# Patient Record
Sex: Female | Born: 1975 | Hispanic: Yes | Marital: Married | Smoking: Never smoker
Health system: Southern US, Community
[De-identification: ages and names within clinical notes are randomized; demographics above are authoritative.]

## PROBLEM LIST (undated history)

## (undated) DIAGNOSIS — E079 Disorder of thyroid, unspecified: Secondary | ICD-10-CM

## (undated) DIAGNOSIS — J45909 Unspecified asthma, uncomplicated: Secondary | ICD-10-CM

---

## 2011-10-20 ENCOUNTER — Other Ambulatory Visit (HOSPITAL_COMMUNITY)
Admission: RE | Admit: 2011-10-20 | Discharge: 2011-10-20 | Disposition: A | Discharge status: Home / Self Care | Payer: BC Managed Care – PPO | Source: Ambulatory Visit | Attending: Obstetrics and Gynecology | Admitting: Obstetrics and Gynecology

## 2011-10-20 DIAGNOSIS — Z124 Encounter for screening for malignant neoplasm of cervix: Secondary | ICD-10-CM | POA: Insufficient documentation

## 2013-02-06 DIAGNOSIS — Z3480 Encounter for supervision of other normal pregnancy, unspecified trimester: Secondary | ICD-10-CM | POA: Insufficient documentation

## 2013-04-13 DIAGNOSIS — O09529 Supervision of elderly multigravida, unspecified trimester: Secondary | ICD-10-CM | POA: Insufficient documentation

## 2013-04-13 DIAGNOSIS — Z3201 Encounter for pregnancy test, result positive: Secondary | ICD-10-CM | POA: Insufficient documentation

## 2013-04-13 DIAGNOSIS — Z331 Pregnant state, incidental: Secondary | ICD-10-CM | POA: Insufficient documentation

## 2013-05-21 DIAGNOSIS — R81 Glycosuria: Secondary | ICD-10-CM | POA: Insufficient documentation

## 2013-06-28 DIAGNOSIS — O0992 Supervision of high risk pregnancy, unspecified, second trimester: Secondary | ICD-10-CM

## 2013-07-29 DIAGNOSIS — L309 Dermatitis, unspecified: Secondary | ICD-10-CM | POA: Insufficient documentation

## 2013-09-02 DIAGNOSIS — Z3009 Encounter for other general counseling and advice on contraception: Secondary | ICD-10-CM | POA: Insufficient documentation

## 2014-02-24 DIAGNOSIS — N921 Excessive and frequent menstruation with irregular cycle: Secondary | ICD-10-CM | POA: Insufficient documentation

## 2016-12-20 ENCOUNTER — Emergency Department (HOSPITAL_COMMUNITY): Payer: BC Managed Care – PPO

## 2016-12-20 ENCOUNTER — Inpatient Hospital Stay (HOSPITAL_COMMUNITY)
Admission: EM | Admit: 2016-12-20 | Discharge: 2016-12-23 | DRG: 781 | Disposition: A | Discharge status: Home / Self Care | Payer: BC Managed Care – PPO | Attending: Internal Medicine | Admitting: Internal Medicine

## 2016-12-20 ENCOUNTER — Encounter (HOSPITAL_COMMUNITY): Payer: Self-pay | Admitting: Emergency Medicine

## 2016-12-20 DIAGNOSIS — O99280 Endocrine, nutritional and metabolic diseases complicating pregnancy, unspecified trimester: Secondary | ICD-10-CM

## 2016-12-20 DIAGNOSIS — J101 Influenza due to other identified influenza virus with other respiratory manifestations: Secondary | ICD-10-CM | POA: Diagnosis not present

## 2016-12-20 DIAGNOSIS — A419 Sepsis, unspecified organism: Secondary | ICD-10-CM | POA: Diagnosis present

## 2016-12-20 DIAGNOSIS — E876 Hypokalemia: Secondary | ICD-10-CM | POA: Diagnosis present

## 2016-12-20 DIAGNOSIS — E0590 Thyrotoxicosis, unspecified without thyrotoxic crisis or storm: Secondary | ICD-10-CM | POA: Diagnosis present

## 2016-12-20 DIAGNOSIS — J45901 Unspecified asthma with (acute) exacerbation: Secondary | ICD-10-CM | POA: Diagnosis present

## 2016-12-20 DIAGNOSIS — O0992 Supervision of high risk pregnancy, unspecified, second trimester: Secondary | ICD-10-CM

## 2016-12-20 DIAGNOSIS — O99282 Endocrine, nutritional and metabolic diseases complicating pregnancy, second trimester: Secondary | ICD-10-CM | POA: Diagnosis present

## 2016-12-20 DIAGNOSIS — O30042 Twin pregnancy, dichorionic/diamniotic, second trimester: Secondary | ICD-10-CM | POA: Diagnosis present

## 2016-12-20 DIAGNOSIS — O30049 Twin pregnancy, dichorionic/diamniotic, unspecified trimester: Secondary | ICD-10-CM | POA: Diagnosis present

## 2016-12-20 DIAGNOSIS — O09522 Supervision of elderly multigravida, second trimester: Secondary | ICD-10-CM

## 2016-12-20 DIAGNOSIS — Z3A21 21 weeks gestation of pregnancy: Secondary | ICD-10-CM

## 2016-12-20 DIAGNOSIS — O98812 Other maternal infectious and parasitic diseases complicating pregnancy, second trimester: Secondary | ICD-10-CM | POA: Diagnosis not present

## 2016-12-20 DIAGNOSIS — O26872 Cervical shortening, second trimester: Secondary | ICD-10-CM | POA: Diagnosis present

## 2016-12-20 DIAGNOSIS — A4189 Other specified sepsis: Secondary | ICD-10-CM | POA: Diagnosis present

## 2016-12-20 DIAGNOSIS — O26879 Cervical shortening, unspecified trimester: Secondary | ICD-10-CM | POA: Diagnosis present

## 2016-12-20 DIAGNOSIS — I959 Hypotension, unspecified: Secondary | ICD-10-CM | POA: Diagnosis not present

## 2016-12-20 DIAGNOSIS — E872 Acidosis: Secondary | ICD-10-CM | POA: Diagnosis present

## 2016-12-20 DIAGNOSIS — J45909 Unspecified asthma, uncomplicated: Secondary | ICD-10-CM | POA: Diagnosis present

## 2016-12-20 DIAGNOSIS — O2622 Pregnancy care for patient with recurrent pregnancy loss, second trimester: Secondary | ICD-10-CM | POA: Diagnosis present

## 2016-12-20 DIAGNOSIS — O99512 Diseases of the respiratory system complicating pregnancy, second trimester: Secondary | ICD-10-CM | POA: Diagnosis present

## 2016-12-20 HISTORY — DX: Disorder of thyroid, unspecified: E07.9

## 2016-12-20 HISTORY — DX: Unspecified asthma, uncomplicated: J45.909

## 2016-12-20 LAB — COMPREHENSIVE METABOLIC PANEL
ALT: 14 U/L (ref 14–54)
ANION GAP: 9 (ref 5–15)
AST: 19 U/L (ref 15–41)
Albumin: 3 g/dL — ABNORMAL LOW (ref 3.5–5.0)
Alkaline Phosphatase: 69 U/L (ref 38–126)
BILIRUBIN TOTAL: 0.5 mg/dL (ref 0.3–1.2)
BUN: 5 mg/dL — AB (ref 6–20)
CO2: 21 mmol/L — ABNORMAL LOW (ref 22–32)
Calcium: 9 mg/dL (ref 8.9–10.3)
Chloride: 106 mmol/L (ref 101–111)
Creatinine, Ser: 0.45 mg/dL (ref 0.44–1.00)
Glucose, Bld: 108 mg/dL — ABNORMAL HIGH (ref 65–99)
POTASSIUM: 3.3 mmol/L — AB (ref 3.5–5.1)
Sodium: 136 mmol/L (ref 135–145)
TOTAL PROTEIN: 6.4 g/dL — AB (ref 6.5–8.1)

## 2016-12-20 LAB — CBC WITH DIFFERENTIAL/PLATELET
BASOS ABS: 0 10*3/uL (ref 0.0–0.1)
Basophils Relative: 0 %
EOS PCT: 0 %
Eosinophils Absolute: 0 10*3/uL (ref 0.0–0.7)
HCT: 36.8 % (ref 36.0–46.0)
HEMOGLOBIN: 12.2 g/dL (ref 12.0–15.0)
LYMPHS PCT: 6 %
Lymphs Abs: 0.6 10*3/uL — ABNORMAL LOW (ref 0.7–4.0)
MCH: 29 pg (ref 26.0–34.0)
MCHC: 33.2 g/dL (ref 30.0–36.0)
MCV: 87.6 fL (ref 78.0–100.0)
Monocytes Absolute: 0.7 10*3/uL (ref 0.1–1.0)
Monocytes Relative: 8 %
NEUTROS ABS: 8.1 10*3/uL — AB (ref 1.7–7.7)
NEUTROS PCT: 86 %
PLATELETS: 126 10*3/uL — AB (ref 150–400)
RBC: 4.2 MIL/uL (ref 3.87–5.11)
RDW: 14.7 % (ref 11.5–15.5)
WBC: 9.4 10*3/uL (ref 4.0–10.5)

## 2016-12-20 LAB — URINALYSIS, ROUTINE W REFLEX MICROSCOPIC
BILIRUBIN URINE: NEGATIVE
Bacteria, UA: NONE SEEN
GLUCOSE, UA: NEGATIVE mg/dL
Hgb urine dipstick: NEGATIVE
KETONES UR: 20 mg/dL — AB
Nitrite: NEGATIVE
PH: 6 (ref 5.0–8.0)
Protein, ur: NEGATIVE mg/dL
Specific Gravity, Urine: 1.012 (ref 1.005–1.030)

## 2016-12-20 LAB — I-STAT ARTERIAL BLOOD GAS, ED
ACID-BASE DEFICIT: 5 mmol/L — AB (ref 0.0–2.0)
BICARBONATE: 18.8 mmol/L — AB (ref 20.0–28.0)
O2 SAT: 96 %
PH ART: 7.404 (ref 7.350–7.450)
PO2 ART: 82 mmHg — AB (ref 83.0–108.0)
Patient temperature: 98
TCO2: 20 mmol/L (ref 0–100)
pCO2 arterial: 29.9 mmHg — ABNORMAL LOW (ref 32.0–48.0)

## 2016-12-20 LAB — T4, FREE: Free T4: 0.62 ng/dL (ref 0.61–1.12)

## 2016-12-20 LAB — I-STAT CG4 LACTIC ACID, ED: LACTIC ACID, VENOUS: 1.06 mmol/L (ref 0.5–1.9)

## 2016-12-20 MED ORDER — IPRATROPIUM-ALBUTEROL 0.5-2.5 (3) MG/3ML IN SOLN
3.0000 mL | Freq: Once | RESPIRATORY_TRACT | Status: AC
  Administered 2016-12-20: 3 mL via RESPIRATORY_TRACT
  Filled 2016-12-20: qty 3

## 2016-12-20 MED ORDER — PREDNISONE 20 MG PO TABS
40.0000 mg | ORAL_TABLET | Freq: Once | ORAL | Status: AC
  Administered 2016-12-20: 40 mg via ORAL
  Filled 2016-12-20: qty 2

## 2016-12-20 MED ORDER — SODIUM CHLORIDE 0.9 % IV BOLUS (SEPSIS)
1000.0000 mL | Freq: Once | INTRAVENOUS | Status: AC
  Administered 2016-12-20: 1000 mL via INTRAVENOUS

## 2016-12-20 NOTE — ED Provider Notes (Signed)
[facility] DEPT Provider Note    By signing my name below, I, Bea Graff, attest that this documentation has been prepared under the direction and in the presence of [facility], . Electronically Signed: Bea Graff, ED Scribe. 12/20/16. 11:20 PM.    History   Chief Complaint Chief Complaint  Patient presents with  . Asthma    The history is provided by the patient and medical records. No language interpreter was used.    Raven Martin is a pregnant 41 y.o.  (G5P1SAB3) with PMHx of asthma at [redacted] weeks gestation with twins who presents to the Emergency Department complaining of a cough that began yesterday. She reports associated HA, mild sore throat, fever, chills and generalized body aches. She has been using her MDI with minimal relief. She denies modifying factors. She denies otalgia, sinus pressure, vaginal bleeding or discharge or any complaint with her pregnancy. She gets her prenatal care in [place] at high risk clinic secondary to hypothyroidism diagnosed during this pregnancy and Advanced Maternal Age with multiple miscarrages.    Past Medical History:  Diagnosis Date  . Asthma   . Thyroid disease     There are no active problems to display for this patient.   History reviewed. No pertinent surgical history.  OB History    Gravida Para Term Preterm AB Living   1             SAB TAB Ectopic Multiple Live Births                   Home Medications    Prior to Admission medications   Medication Sig Start Date End Date Taking? Authorizing Provider  acetaminophen (TYLENOL) 500 MG tablet Take 500 mg by mouth every 6 (six) hours as needed for headache.   Yes Historical Provider, MD  albuterol (PROAIR HFA) 108 (90 Base) MCG/ACT inhaler Inhale 2 puffs into the lungs every 4 (four) hours as needed for wheezing or shortness of breath.  09/07/16  Yes Historical Provider, MD  methimazole (TAPAZOLE) 5 MG tablet Take 5 mg by mouth every other day.  11/28/16 11/28/17 Yes Historical Provider, MD  Prenatal Vit-Fe Fumarate-FA (PRENATAL MULTIVITAMIN) TABS tablet Take 1 tablet by mouth daily.    Yes Historical Provider, MD    Family History History reviewed. No pertinent family history.  Social History Social History  Substance Use Topics  . Smoking status: Never Smoker  . Smokeless tobacco: Never Used  . Alcohol use No     Allergies   Patient has no known allergies.   Review of Systems Review of Systems  Constitutional: Positive for chills, fatigue and fever.  HENT: Positive for sore throat. Negative for congestion, dental problem, ear pain, facial swelling, sinus pressure and trouble swallowing.   Eyes: Negative for photophobia and pain.  Respiratory: Positive for cough and shortness of breath. Negative for wheezing.   Gastrointestinal: Negative for abdominal distention, abdominal pain, diarrhea, nausea and vomiting.  Genitourinary: Negative for dysuria, flank pain and vaginal bleeding.  Musculoskeletal: Positive for myalgias. Negative for back pain and neck stiffness.  Skin: Negative for rash.  Neurological: Positive for headaches. Negative for light-headedness.  Psychiatric/Behavioral: Negative for confusion.     Physical Exam Updated Vital Signs BP 129/74 (BP Location: Right Arm)   Pulse (!) 135   Temp 99.9 F (37.7 C) (Oral)   Resp 18   SpO2 99%   Physical Exam  Constitutional: She is oriented to person, place, and time. She appears  well-developed and well-nourished.  HENT:  Head: Normocephalic.  Right Ear: Tympanic membrane normal.  Left Ear: Tympanic membrane normal.  Mouth/Throat: Uvula is midline, oropharynx is clear and moist and mucous membranes are normal. No posterior oropharyngeal edema or posterior oropharyngeal erythema.  Eyes: EOM are normal. Pupils are equal, round, and reactive to light.  Sclera clear bilaterally.  Neck: Normal range of motion. Neck supple.  Cardiovascular: Regular rhythm.   Tachycardia present.   Pulmonary/Chest: Tachypnea noted. She has decreased breath sounds. She has no wheezes. She has no rhonchi. She has no rales.  Abdominal: Soft. There is no tenderness.  Fetal heart tones- 160 and 145  Musculoskeletal: Normal range of motion.  Lymphadenopathy:    She has no cervical adenopathy.  Neurological: She is alert and oriented to person, place, and time. No cranial nerve deficit.  Skin: Skin is warm and dry.  Psychiatric: She has a normal mood and affect.  Nursing note and vitals reviewed.    ED Treatments / Results  DIAGNOSTIC STUDIES: Oxygen Saturation is 99% on RA, normal by my interpretation.   COORDINATION OF CARE: 7:01 PM- Will order breathing treatment and prescribe steroids. Pt verbalizes understanding and agrees to plan.  8:15 PM- Dr. Billy Fischer examined pt and has concerns for PE. Will order CXR and flu swab.  CRITICAL CARE Performed by: [place], [place]ou Total critical care time: 30 minutes Critical care time was exclusive of separately billable procedures and treating other patients. Critical care was necessary to treat or prevent imminent or life-threatening deterioration. Critical care was time spent personally by me on the following activities: development of treatment plan with patient and/or surrogate as well as nursing, discussions with consultants, evaluation of patient's response to treatment, examination of patient, obtaining history from patient or surrogate, ordering and performing treatments and interventions, ordering and review of laboratory studies, ordering and review of radiographic studies, pulse oximetry and re-evaluation of patient's condition.   Medications  ipratropium-albuterol (DUONEB) 0.5-2.5 (3) MG/3ML nebulizer solution 3 mL (3 mLs Nebulization Given 12/20/16 1925)  predniSONE (DELTASONE) tablet 40 mg (40 mg Oral Given 12/20/16 1923)  sodium chloride 0.9 % bolus 1,000 mL (0 mLs Intravenous Stopped 12/20/16 2240)    oseltamivir (TAMIFLU) capsule 75 mg (75 mg Oral Given 12/21/16 0032)  sodium chloride 0.9 % bolus 1,000 mL (1,000 mLs Intravenous New Bag/Given 12/21/16 0041)    Labs (all labs ordered are listed, but only abnormal results are displayed) Labs Reviewed  INFLUENZA PANEL BY PCR (TYPE A & B) - Abnormal; Notable for the following:       Result Value   Influenza A By PCR POSITIVE (*)    All other components within normal limits  COMPREHENSIVE METABOLIC PANEL - Abnormal; Notable for the following:    Potassium 3.3 (*)    CO2 21 (*)    Glucose, Bld 108 (*)    BUN 5 (*)    Total Protein 6.4 (*)    Albumin 3.0 (*)    All other components within normal limits  CBC WITH DIFFERENTIAL/PLATELET - Abnormal; Notable for the following:    Platelets 126 (*)    Neutro Abs 8.1 (*)    Lymphs Abs 0.6 (*)    All other components within normal limits  URINALYSIS, ROUTINE W REFLEX MICROSCOPIC - Abnormal; Notable for the following:    APPearance HAZY (*)    Ketones, ur 20 (*)    Leukocytes, UA TRACE (*)    Squamous Epithelial / LPF 6-30 (*)  All other components within normal limits  I-STAT ARTERIAL BLOOD GAS, ED - Abnormal; Notable for the following:    pCO2 arterial 29.9 (*)    pO2, Arterial 82.0 (*)    Bicarbonate 18.8 (*)    Acid-base deficit 5.0 (*)    All other components within normal limits  I-STAT CG4 LACTIC ACID, ED - Abnormal; Notable for the following:    Lactic Acid, Venous 2.84 (*)    All other components within normal limits  CULTURE, BLOOD (ROUTINE X 2)  CULTURE, BLOOD (ROUTINE X 2)  T4, FREE  T3  T4  BLOOD GAS, ARTERIAL  I-STAT CG4 LACTIC ACID, ED    EKG  EKG Interpretation  Date/Time:  Tuesday December 20 2016 22:19:49 EST Ventricular Rate:  127 PR Interval:    QRS Duration: 74 QT Interval:  296 QTC Calculation: 431 R Axis:   102 Text Interpretation:  Sinus tachycardia Probable left atrial enlargement Borderline right axis deviation No old tracing to compare  Confirmed by KNAPP  MD-J, JON [id number]) on 12/20/2016 10:26:47 PM      I discussed this case with Dr. Billy Fischer and she evaluated the patient. Will continue IV fluids and lab results.   I discussed this case with Dr. Tomi Bamberger after Dr. Billy Fischer left and additional labs were back.    Radiology Dg Chest 2 View  Result Date: 12/20/2016 CLINICAL DATA:  Cough EXAM: CHEST  2 VIEW COMPARISON:  None. FINDINGS: Lungs are clear. Heart size and pulmonary vascularity are within normal limits. No adenopathy. No bone lesions. IMPRESSION: No edema or consolidation. Electronically Signed   By: Lowella Grip III M.D.   On: 12/20/2016 21:22    Procedures Procedures  Medications Ordered in ED Medications  ipratropium-albuterol (DUONEB) 0.5-2.5 (3) MG/3ML nebulizer solution 3 mL (3 mLs Nebulization Given 12/20/16 1925)  predniSONE (DELTASONE) tablet 40 mg (40 mg Oral Given 12/20/16 1923)  sodium chloride 0.9 % bolus 1,000 mL (0 mLs Intravenous Stopped 12/20/16 2240)  oseltamivir (TAMIFLU) capsule 75 mg (75 mg Oral Given 12/21/16 0032)  sodium chloride 0.9 % bolus 1,000 mL (1,000 mLs Intravenous New Bag/Given 12/21/16 0041)   12 MN consult with Dr. Ihor Dow at [facility]. She request that the patient be admitted here at [facility] since she is less than [redacted] weeks gestation and she will be glad to consult.   Initial Impression / Assessment and Plan / ED Course  I have reviewed the triage vital signs and the nursing notes.  Pertinent labs & imaging results that were available during my care of the patient were reviewed by me and considered in my medical decision making (see chart for details).  I personally performed the services described in this documentation, which was scribed in my presence. The recorded information has been reviewed and is accurate.   Consult with admitting MD and he will accept patient.   Final Clinical Impressions(s) / ED Diagnoses  41 y.o. G5 P1 @ [redacted] weeks gestation with  influenza to be admitted for continued treatment and observation.   Final diagnoses:  Influenza A  High-risk pregnancy in second trimester    New Prescriptions New Prescriptions   No medications on file     [facility], NP 12/21/16 0112    Gareth Morgan, MD 12/21/16 1058

## 2016-12-20 NOTE — ED Notes (Signed)
PT moved to room with bed and placed on cardiac monitor. Dr. Billy Fischer at bedside assessing pt. Charge nurse aware of pt condition and vs

## 2016-12-20 NOTE — ED Notes (Signed)
Pt states she is [redacted] weeks pregnant with twins.

## 2016-12-20 NOTE — ED Triage Notes (Signed)
Pt sts coughing and using inhaler with only small amount of relief; pt sts HA; pt is [redacted] weeks pregnant with twins at present

## 2016-12-21 ENCOUNTER — Encounter (HOSPITAL_COMMUNITY): Payer: Self-pay

## 2016-12-21 DIAGNOSIS — O2622 Pregnancy care for patient with recurrent pregnancy loss, second trimester: Secondary | ICD-10-CM | POA: Diagnosis present

## 2016-12-21 DIAGNOSIS — E0590 Thyrotoxicosis, unspecified without thyrotoxic crisis or storm: Secondary | ICD-10-CM | POA: Diagnosis present

## 2016-12-21 DIAGNOSIS — A4189 Other specified sepsis: Secondary | ICD-10-CM | POA: Diagnosis present

## 2016-12-21 DIAGNOSIS — O99512 Diseases of the respiratory system complicating pregnancy, second trimester: Secondary | ICD-10-CM | POA: Diagnosis present

## 2016-12-21 DIAGNOSIS — O99280 Endocrine, nutritional and metabolic diseases complicating pregnancy, unspecified trimester: Secondary | ICD-10-CM

## 2016-12-21 DIAGNOSIS — A419 Sepsis, unspecified organism: Secondary | ICD-10-CM

## 2016-12-21 DIAGNOSIS — O30042 Twin pregnancy, dichorionic/diamniotic, second trimester: Secondary | ICD-10-CM | POA: Diagnosis present

## 2016-12-21 DIAGNOSIS — Z3A21 21 weeks gestation of pregnancy: Secondary | ICD-10-CM | POA: Diagnosis not present

## 2016-12-21 DIAGNOSIS — O09522 Supervision of elderly multigravida, second trimester: Secondary | ICD-10-CM | POA: Diagnosis not present

## 2016-12-21 DIAGNOSIS — O0992 Supervision of high risk pregnancy, unspecified, second trimester: Secondary | ICD-10-CM

## 2016-12-21 DIAGNOSIS — E876 Hypokalemia: Secondary | ICD-10-CM | POA: Diagnosis present

## 2016-12-21 DIAGNOSIS — J45901 Unspecified asthma with (acute) exacerbation: Secondary | ICD-10-CM

## 2016-12-21 DIAGNOSIS — O30049 Twin pregnancy, dichorionic/diamniotic, unspecified trimester: Secondary | ICD-10-CM | POA: Diagnosis not present

## 2016-12-21 DIAGNOSIS — J45909 Unspecified asthma, uncomplicated: Secondary | ICD-10-CM | POA: Diagnosis present

## 2016-12-21 DIAGNOSIS — O26872 Cervical shortening, second trimester: Secondary | ICD-10-CM | POA: Diagnosis present

## 2016-12-21 DIAGNOSIS — J101 Influenza due to other identified influenza virus with other respiratory manifestations: Secondary | ICD-10-CM

## 2016-12-21 DIAGNOSIS — O26879 Cervical shortening, unspecified trimester: Secondary | ICD-10-CM | POA: Diagnosis not present

## 2016-12-21 DIAGNOSIS — E872 Acidosis: Secondary | ICD-10-CM | POA: Diagnosis present

## 2016-12-21 DIAGNOSIS — O98812 Other maternal infectious and parasitic diseases complicating pregnancy, second trimester: Secondary | ICD-10-CM | POA: Diagnosis present

## 2016-12-21 DIAGNOSIS — O99282 Endocrine, nutritional and metabolic diseases complicating pregnancy, second trimester: Secondary | ICD-10-CM | POA: Diagnosis present

## 2016-12-21 DIAGNOSIS — I959 Hypotension, unspecified: Secondary | ICD-10-CM | POA: Diagnosis not present

## 2016-12-21 LAB — INFLUENZA PANEL BY PCR (TYPE A & B)
INFLBPCR: NEGATIVE
Influenza A By PCR: POSITIVE — AB

## 2016-12-21 LAB — BASIC METABOLIC PANEL
Anion gap: 9 (ref 5–15)
CO2: 20 mmol/L — AB (ref 22–32)
Calcium: 8.6 mg/dL — ABNORMAL LOW (ref 8.9–10.3)
Chloride: 108 mmol/L (ref 101–111)
Creatinine, Ser: 0.52 mg/dL (ref 0.44–1.00)
GFR calc Af Amer: >60 mL/min (ref >60)
GFR calc non Af Amer: >60 mL/min (ref >60)
Glucose, Bld: 120 mg/dL — ABNORMAL HIGH (ref 65–99)
POTASSIUM: 4.2 mmol/L (ref 3.5–5.1)
Sodium: 137 mmol/L (ref 135–145)

## 2016-12-21 LAB — MRSA PCR SCREENING: MRSA BY PCR: NEGATIVE

## 2016-12-21 LAB — I-STAT CG4 LACTIC ACID, ED: Lactic Acid, Venous: 2.84 mmol/L (ref 0.5–1.9)

## 2016-12-21 LAB — CBC
HEMATOCRIT: 30.7 % — AB (ref 36.0–46.0)
HEMOGLOBIN: 10.2 g/dL — AB (ref 12.0–15.0)
MCH: 29.3 pg (ref 26.0–34.0)
MCHC: 33.2 g/dL (ref 30.0–36.0)
MCV: 88.2 fL (ref 78.0–100.0)
Platelets: 126 10*3/uL — ABNORMAL LOW (ref 150–400)
RBC: 3.48 MIL/uL — AB (ref 3.87–5.11)
RDW: 14.6 % (ref 11.5–15.5)
WBC: 7.5 10*3/uL (ref 4.0–10.5)

## 2016-12-21 LAB — LACTIC ACID, PLASMA
LACTIC ACID, VENOUS: 1.5 mmol/L (ref 0.5–1.9)
Lactic Acid, Venous: 1.7 mmol/L (ref 0.5–1.9)

## 2016-12-21 MED ORDER — SODIUM CHLORIDE 0.9 % IV BOLUS (SEPSIS)
1000.0000 mL | Freq: Once | INTRAVENOUS | Status: AC
  Administered 2016-12-21: 1000 mL via INTRAVENOUS

## 2016-12-21 MED ORDER — OSELTAMIVIR PHOSPHATE 75 MG PO CAPS
75.0000 mg | ORAL_CAPSULE | Freq: Two times a day (BID) | ORAL | Status: DC
  Administered 2016-12-21 – 2016-12-23 (×5): 75 mg via ORAL
  Filled 2016-12-21 (×6): qty 1

## 2016-12-21 MED ORDER — PREDNISONE 20 MG PO TABS
40.0000 mg | ORAL_TABLET | Freq: Every day | ORAL | Status: DC
  Administered 2016-12-21: 40 mg via ORAL
  Filled 2016-12-21: qty 2

## 2016-12-21 MED ORDER — IPRATROPIUM-ALBUTEROL 0.5-2.5 (3) MG/3ML IN SOLN: 3.0000 mL | RESPIRATORY_TRACT | Status: DC | PRN

## 2016-12-21 MED ORDER — ENOXAPARIN SODIUM 40 MG/0.4ML ~~LOC~~ SOLN
40.0000 mg | SUBCUTANEOUS | Status: DC
  Administered 2016-12-21 – 2016-12-23 (×3): 40 mg via SUBCUTANEOUS
  Filled 2016-12-21 (×3): qty 0.4

## 2016-12-21 MED ORDER — PRENATAL PLUS 27-1 MG PO TABS
1.0000 | ORAL_TABLET | Freq: Every day | ORAL | Status: DC
  Administered 2016-12-21 – 2016-12-23 (×3): 1 via ORAL
  Filled 2016-12-21 (×4): qty 1

## 2016-12-21 MED ORDER — GUAIFENESIN-DM 100-10 MG/5ML PO SYRP
5.0000 mL | ORAL_SOLUTION | ORAL | Status: DC | PRN
  Administered 2016-12-21: 5 mL via ORAL
  Filled 2016-12-21: qty 5

## 2016-12-21 MED ORDER — OSELTAMIVIR PHOSPHATE 75 MG PO CAPS
75.0000 mg | ORAL_CAPSULE | Freq: Once | ORAL | Status: AC
  Administered 2016-12-21: 75 mg via ORAL
  Filled 2016-12-21: qty 1

## 2016-12-21 MED ORDER — PROMETHAZINE HCL 25 MG PO TABS: 12.5000 mg | ORAL_TABLET | Freq: Four times a day (QID) | ORAL | Status: DC | PRN

## 2016-12-21 MED ORDER — IPRATROPIUM-ALBUTEROL 0.5-2.5 (3) MG/3ML IN SOLN
3.0000 mL | Freq: Once | RESPIRATORY_TRACT | Status: AC
  Administered 2016-12-21: 3 mL via RESPIRATORY_TRACT
  Filled 2016-12-21: qty 3

## 2016-12-21 MED ORDER — SODIUM CHLORIDE 0.9 % IV SOLN
INTRAVENOUS | Status: DC
  Administered 2016-12-21 – 2016-12-23 (×4): via INTRAVENOUS

## 2016-12-21 MED ORDER — METHIMAZOLE 5 MG PO TABS
5.0000 mg | ORAL_TABLET | ORAL | Status: DC
  Administered 2016-12-21 – 2016-12-23 (×2): 5 mg via ORAL
  Filled 2016-12-21 (×2): qty 1

## 2016-12-21 MED ORDER — ACETAMINOPHEN 500 MG PO TABS
500.0000 mg | ORAL_TABLET | Freq: Four times a day (QID) | ORAL | Status: DC | PRN
  Administered 2016-12-21 – 2016-12-23 (×3): 500 mg via ORAL
  Filled 2016-12-21 (×3): qty 1

## 2016-12-21 MED ORDER — POTASSIUM CHLORIDE CRYS ER 20 MEQ PO TBCR
40.0000 meq | EXTENDED_RELEASE_TABLET | ORAL | Status: AC
  Administered 2016-12-21: 40 meq via ORAL
  Filled 2016-12-21: qty 2

## 2016-12-21 MED ORDER — LEVALBUTEROL HCL 0.63 MG/3ML IN NEBU
0.6300 mg | INHALATION_SOLUTION | RESPIRATORY_TRACT | Status: DC | PRN
  Administered 2016-12-22: 0.63 mg via RESPIRATORY_TRACT
  Filled 2016-12-21: qty 3

## 2016-12-21 NOTE — Progress Notes (Signed)
[facility] TEAM 1 - Stepdown/ICU TEAM  Raven Martin  [id number] DOB: [birth year] DOA: 12/20/2016 PCP: Senaida Ores, MD    Brief Narrative:  41 y.o. female G5P1 031 at 61 weeks with medical history significant of asthma and hypothyroidism diagnosed during pregnancy; presents with complaints of cough and shortness of breath that initially started approximately 3 days ago. She reports having a nonproductive cough for which it was hard for her to catch her breath as though she will have any asthma exacerbation. She reports associated symptoms of chills, generalized body aches, headache, sore throat secondary to coughing, subjective fever. She tried using a albuterol inhaler with only minimal relief of symptoms. Denies having any sinus congestion, vaginal discharge, dysuria, or abdominal pain. She is being followed by [place] [facility] due to advanced maternal age, history of miscarriages and hyperthyroidism as well as [place] [facility].   ED Course: Upon admission to the emergency department patient was seen to have a temperature of 99.20F, pulse elevated to 143, respirations elevated to 36, blood pressure as low as 99/57, and O2 saturations maintained on room air. Lab work revealed WBC 9.4, hemoglobin 12.2, platelets 126, potassium 3.3, CO2 21, BUN 5, creatinine 0.45, glucose 108, and lactic acid initially 1.06 (repeat 2.84). Patient was found to be positive for influenza A. Chest x-ray showed no focal infiltrate. She was started on Tamiflu. TR[facility] called as patient less than [redacted] weeks pregnant. OB/GYN to help if needed.   Subjective: Pt is seen for a f/u visit.    Assessment & Plan:  Sepsis secondary to Influenza A - lactic acidosis  Continue Tamiflu - lactic acidosis resolved w/ volume   Mild acute asthma exacerbation No more wheezing - stop steroids and follow  Pregnancy [redacted] weeks pregnant with twins - high risk secondary to advanced maternal age - being followed  by [facility] healthcare OB/GYN - OB RR RN to check fetal heart tones Q shift per protocol   Hyperthyroidism Followed by [facility] - continue methimazole - Free T4 is low normal   Hypokalemia Corrected   DVT prophylaxis: lovenox  Code Status: FULL CODE Family Communication: no family present at time of exam  Disposition Plan:   Consultants:  none  Procedures: none  Antimicrobials:  Antibiotics Given (last 72 hours)    Date/Time Action Medication Dose   12/21/16 0032 Given   oseltamivir (TAMIFLU) capsule 75 mg 75 mg   12/21/16 0756 Given   oseltamivir (TAMIFLU) capsule 75 mg 75 mg       Objective: Blood pressure (!) 108/50, pulse (!) 112, temperature 99.2 F (37.3 C), temperature source Oral, resp. rate 20, weight 65.3 kg (144 lb), SpO2 99 %.  Intake/Output Summary (Last 24 hours) at 12/21/16 1619 Last data filed at 12/21/16 0300  Gross per 24 hour  Intake          2066.67 ml  Output                0 ml  Net          2066.67 ml   Filed Weights   12/20/16 2131  Weight: 65.3 kg (144 lb)    Examination: Pt was seen for a f/u visit.    CBC:  Recent Labs Lab 12/20/16 2058 12/21/16 0419  WBC 9.4 7.5  NEUTROABS 8.1*  --   HGB 12.2 10.2*  HCT 36.8 30.7*  MCV 87.6 88.2  PLT 126* 123XX123*   Basic Metabolic Panel:  Recent Labs Lab 12/20/16 2058  12/21/16 0419  NA 136 137  K 3.3* 4.2  CL 106 108  CO2 21* 20*  GLUCOSE 108* 120*  BUN 5* <5*  CREATININE 0.45 0.52  CALCIUM 9.0 8.6*   GFR: CrCl cannot be calculated (Unknown ideal weight.).  Liver Function Tests:  Recent Labs Lab 12/20/16 2058  AST 19  ALT 14  ALKPHOS 69  BILITOT 0.5  PROT 6.4*  ALBUMIN 3.0*     Recent Results (from the past 240 hour(s))  Blood Culture (routine x 2)     Status: None (Preliminary result)   Collection Time: 12/20/16  8:42 PM  Result Value Ref Range Status   Specimen Description BLOOD LEFT ANTECUBITAL  Final   Special Requests BOTTLES DRAWN AEROBIC  ONLY 5CC  Final   Culture NO GROWTH < 24 HOURS  Final   Report Status PENDING  Incomplete  Blood Culture (routine x 2)     Status: None (Preliminary result)   Collection Time: 12/20/16 10:33 PM  Result Value Ref Range Status   Specimen Description BLOOD RIGHT ARM  Final   Special Requests BOTTLES DRAWN AEROBIC AND ANAEROBIC 5ML  Final   Culture NO GROWTH < 24 HOURS  Final   Report Status PENDING  Incomplete  MRSA PCR Screening     Status: None   Collection Time: 12/21/16  3:42 AM  Result Value Ref Range Status   MRSA by PCR NEGATIVE NEGATIVE Final    Comment:        The GeneXpert MRSA Assay (FDA approved for NASAL specimens only), is one component of a comprehensive MRSA colonization surveillance program. It is not intended to diagnose MRSA infection nor to guide or monitor treatment for MRSA infections.      Scheduled Meds: . enoxaparin (LOVENOX) injection  40 mg Subcutaneous Q24H  . methimazole  5 mg Oral QODAY  . oseltamivir  75 mg Oral BID  . predniSONE  40 mg Oral Q breakfast  . prenatal vitamin w/FE, FA  1 tablet Oral Daily   Continuous Infusions: . sodium chloride 100 mL/hr at 12/21/16 1149     LOS: 0 days   Time spent: No Charge  Cherene Altes, MD [facility] Office  [phone] Pager - Text Page per Shea Evans as per below:  On-Call/Text Page:      [email or web address]      password [email or web address]  If 7PM-7AM, please contact night-coverage [email or web address] Password [facility] 12/21/2016, 4:19 PM

## 2016-12-21 NOTE — Progress Notes (Signed)
Pt sees Dr Joya Gaskins at [facility] in [place].  Will deliver at [facility] per pt due to her being high risk. Dopplered A in lower middle above pubic bone.  Dopplered B in [place].  Infants both boys.  Pt reports good fetal movement, no bleeding or leaking.  Dr Baron Sane updated on pt status via phone.  Change order to doppler q day.

## 2016-12-21 NOTE — H&P (Signed)
History and Physical    Ryanne Morales-Burgos [id number] DOB: 1976-03-19 DOA: 12/20/2016  Referring MD/NP/PA: Debroah Baller, NP PCP: Senaida Ores, MD  Patient coming from: Home  Chief Complaint: Cough  HPI: Raven Martin is a 41 y.o. female G5P1 031 at 21 weeks with medical history significant of asthma and hypothyroidism diagnosed during pregnancy; presents with complaints of cough and shortness of breath that initially started approximately 3 days ago. She reports having a nonproductive cough for which it was hard for her to catch her breath as though she will have any asthma exacerbation. She reports associated symptoms of chills, generalized body aches, headache, sore throat secondary to coughing, subjective fever. She tried using a albuterol inhaler with only minimal relief of symptoms. Denies having any sinus congestion, vaginal discharge, dysuria, or abdominal pain. She is being followed by [place] [facility] due to advanced maternal age, history of miscarriages and hyperthyroidism as well as [place] [facility].   ED Course: Upon admission to the emergency department patient was seen to have a temperature of 99.62F, pulse elevated to 143, respirations elevated to 36, blood pressure as low as 99/57, and O2 saturations maintained on room air. Lab work revealed WBC 9.4, hemoglobin 12.2, platelets 126, potassium 3.3, CO2 21, BUN 5, creatinine 0.45, glucose 108, and lactic acid initially 1.06 (repeat 2.84). Patient was found to be positive for influenza A. Chest x-ray showed no focal infiltrate. She was started on Tamiflu. TR[facility] called as patient less than [redacted] weeks pregnant. OB/GYN to help if needed.  Review of Systems: As per HPI otherwise 10 point review of systems negative.   Past Medical History:  Diagnosis Date  . Asthma   . Thyroid disease     History reviewed. No pertinent surgical history.   reports that she has never smoked. She has never used smokeless  tobacco. She reports that she does not drink alcohol or use drugs.  No Known Allergies  History reviewed. No pertinent family history.  Prior to Admission medications   Medication Sig Start Date End Date Taking? Authorizing Provider  acetaminophen (TYLENOL) 500 MG tablet Take 500 mg by mouth every 6 (six) hours as needed for headache.   Yes Historical Provider, MD  albuterol (PROAIR HFA) 108 (90 Base) MCG/ACT inhaler Inhale 2 puffs into the lungs every 4 (four) hours as needed for wheezing or shortness of breath.  09/07/16  Yes Historical Provider, MD  methimazole (TAPAZOLE) 5 MG tablet Take 5 mg by mouth every other day. 11/28/16 11/28/17 Yes Historical Provider, MD  Prenatal Vit-Fe Fumarate-FA (PRENATAL MULTIVITAMIN) TABS tablet Take 1 tablet by mouth daily.    Yes Historical Provider, MD    Physical Exam:  Constitutional: Young female who appears acutely sick, but non-toxic. Vitals:   12/20/16 2335 12/21/16 0000 12/21/16 0030 12/21/16 0100  BP:  105/55 (!) 100/54 99/57  Pulse:  120 118 120  Resp:  24 (!) 29 22  Temp:      TempSrc:      SpO2: 98% 96% 96% 98%  Weight:       Eyes: PERRL, lids and conjunctivae normal ENMT: Mucous membranes are moist. Posterior pharynx clear of any exudate or lesions.Normal dentition.  Neck: normal, supple, no masses, no thyromegaly Respiratory: Tachypneic, patient speaking in shortened sentences. Mild expiratory wheezes appreciated. Cardiovascular: Tachycardic, no murmurs / rubs / gallops. No extremity edema. 2+ pedal pulses. No carotid bruits.  Abdomen:Protuberant abdomen, no tenderness, no masses palpated. No hepatosplenomegaly. Bowel sounds positive.  Musculoskeletal: no clubbing /  cyanosis. No joint deformity upper and lower extremities. Good ROM, no contractures. Normal muscle tone.  Skin: no rashes, lesions, ulcers. No induration Neurologic: CN 2-12 grossly intact. Sensation intact, DTR normal. Strength 5/5 in all 4.  Psychiatric: Normal  judgment and insight. Alert and oriented x 3. Normal mood.     Labs on Admission: I have personally reviewed following labs and imaging studies  CBC:  Recent Labs Lab 12/20/16 2058  WBC 9.4  NEUTROABS 8.1*  HGB 12.2  HCT 36.8  MCV 87.6  PLT 123XX123*   Basic Metabolic Panel:  Recent Labs Lab 12/20/16 2058  NA 136  K 3.3*  CL 106  CO2 21*  GLUCOSE 108*  BUN 5*  CREATININE 0.45  CALCIUM 9.0   GFR: CrCl cannot be calculated (Unknown ideal weight.). Liver Function Tests:  Recent Labs Lab 12/20/16 2058  AST 19  ALT 14  ALKPHOS 69  BILITOT 0.5  PROT 6.4*  ALBUMIN 3.0*   No results for input(s): LIPASE, AMYLASE in the last 168 hours. No results for input(s): AMMONIA in the last 168 hours. Coagulation Profile: No results for input(s): INR, PROTIME in the last 168 hours. Cardiac Enzymes: No results for input(s): CKTOTAL, CKMB, CKMBINDEX, TROPONINI in the last 168 hours. BNP (last 3 results) No results for input(s): PROBNP in the last 8760 hours. HbA1C: No results for input(s): HGBA1C in the last 72 hours. CBG: No results for input(s): GLUCAP in the last 168 hours. Lipid Profile: No results for input(s): CHOL, HDL, LDLCALC, TRIG, CHOLHDL, LDLDIRECT in the last 72 hours. Thyroid Function Tests:  Recent Labs  12/20/16 2053  FREET4 0.62   Anemia Panel: No results for input(s): VITAMINB12, FOLATE, FERRITIN, TIBC, IRON, RETICCTPCT in the last 72 hours. Urine analysis:    Component Value Date/Time   COLORURINE YELLOW 12/20/2016 2116   APPEARANCEUR HAZY (A) 12/20/2016 2116   LABSPEC 1.012 12/20/2016 2116   PHURINE 6.0 12/20/2016 2116   GLUCOSEU NEGATIVE 12/20/2016 2116   HGBUR NEGATIVE 12/20/2016 2116   BILIRUBINUR NEGATIVE 12/20/2016 2116   KETONESUR 20 (A) 12/20/2016 2116   PROTEINUR NEGATIVE 12/20/2016 2116   NITRITE NEGATIVE 12/20/2016 2116   LEUKOCYTESUR TRACE (A) 12/20/2016 2116   Sepsis Labs: No results found for this or any previous visit (from  the past 240 hour(s)).   Radiological Exams on Admission: Dg Chest 2 View  Result Date: 12/20/2016 CLINICAL DATA:  Cough EXAM: CHEST  2 VIEW COMPARISON:  None. FINDINGS: Lungs are clear. Heart size and pulmonary vascularity are within normal limits. No adenopathy. No bone lesions. IMPRESSION: No edema or consolidation. Electronically Signed   By: Lowella Grip III M.D.   On: 12/20/2016 21:22    EKG: Independently reviewed. Sinus tachycardia 1 27 bpm  Assessment/Plan Sepsis secondary to influenza A - Admit to stepdown bed - Sepsis protocol initiated - Continue Tamiflu - Trend lactic acid levels - Tylenol prn fever  Possible asthma exacerbation: Patient noted to have mild respiratory wheezes on physical exam.  - Continuous pulse oximetry with nasal cannula oxygen keep O2 sats greater than 92%  - DuoNeb's prn - Continue prednisone  40 mg daily  Pregnancy: Patient is a [redacted] weeks pregnant with twins. Noted to be high risk secondary to advanced maternal age, previous miscarriages. She is currently being followed by [facility] healthcare OB/GYN - Check fetal heart tones every shift   Hyperthyroidism: Followed by [facility] healthcare endocrinology. - Continue methimazole  Hypokalemia:Acute. Initial potassium 3.3 on admission.  - Give 40 mEq  of potassium chloride 1 dose now.  - Continue to monitor and replace as needed.   DVT prophylaxis: Lovenox   Code Status: full  Family Communication: Discussed plan of care with the patient and husband present at bedside Disposition Plan:  likely discharge home in 2-3 days  Consults called: none Admission status: Inpatient  Norval Morton MD [facility] Pager [phone]  If 7PM-7AM, please contact night-coverage [email or web address] Password [email or web address]  12/21/2016, 1:09 AM

## 2016-12-21 NOTE — Progress Notes (Signed)
[facility] and spoke to women's rapid response nurse about the order for this patient to have fetal heart tones assessed for the twins every shift. She explained that patient will be dopplered for that. On this unit we doppler pulses but don't have the equipment or education to doppler fetal heart tones. Women's rapid RN stated she would be able to come over later today and will do the doppler.

## 2016-12-22 ENCOUNTER — Encounter (HOSPITAL_COMMUNITY): Payer: Self-pay | Admitting: Family Medicine

## 2016-12-22 DIAGNOSIS — O26879 Cervical shortening, unspecified trimester: Secondary | ICD-10-CM | POA: Diagnosis present

## 2016-12-22 DIAGNOSIS — O99282 Endocrine, nutritional and metabolic diseases complicating pregnancy, second trimester: Secondary | ICD-10-CM

## 2016-12-22 LAB — BASIC METABOLIC PANEL
Anion gap: 7 (ref 5–15)
BUN: 6 mg/dL (ref 6–20)
CHLORIDE: 110 mmol/L (ref 101–111)
CO2: 21 mmol/L — ABNORMAL LOW (ref 22–32)
CREATININE: 0.47 mg/dL (ref 0.44–1.00)
Calcium: 8.3 mg/dL — ABNORMAL LOW (ref 8.9–10.3)
GFR calc Af Amer: >60 mL/min (ref >60)
GFR calc non Af Amer: >60 mL/min (ref >60)
Glucose, Bld: 90 mg/dL (ref 65–99)
Potassium: 3.7 mmol/L (ref 3.5–5.1)
SODIUM: 138 mmol/L (ref 135–145)

## 2016-12-22 LAB — T4: T4, Total: 8.9 ug/dL (ref 4.5–12.0)

## 2016-12-22 LAB — T3: T3, Total: 178 ng/dL (ref 71–180)

## 2016-12-22 NOTE — Progress Notes (Signed)
Pt. Being seen today for qd fetal doppler.  Baby A FHR 140-150 with Baby B FHR 155-170.  No OB complaints from the patient.  Denies  LOF, vaginal bleeding, or any uc's. Report given to Dr. Harolyn Rutherford regarding FHR.

## 2016-12-22 NOTE — Consult Note (Addendum)
[facility] - Faculty Practice  Impression: Principal Problem:   Sepsis [facility]) Active Problems:   Influenza A   Hyperthyroidism affecting pregnancy   Dichorionic diamniotic twin pregnancy, antepartum   Hypokalemia   Asthma   High-risk pregnancy in second trimester   Vasa previa   Short cervix, antepartum    Recommendations: Continue Tamiflu Continue Methimazole Continue PNV Patient may be discharged when you feel she is clinically stable, I.e. Maintaining her oxygenation and has stable vitals signs. She may continue to have some mild tachycardia and lower BP related to progestational changes, which can be more pronounced with twins and at this stage of pregnancy. Has f/u with endocrinology and MFM early next week-->should keep appointments.   Reason for consult: Patient is a 41 y.o. G49P1031 female who was admitted with Influenza and sepsis. She currently is on Tamiflu, has a negative CXR and negative BC x 2  We are asked to see the patient regarding high risk pregnancy. Patient is known to have DC/DA twins at 81 6/7 wks with short cervix and vasa previa. Not a candidate for Prometrium due to the vasa previa. She reports excellent fetal movement. She is feeling much better today. She reports no bleeding or LOF.  Past Medical History:  Diagnosis Date  . Asthma   . Thyroid disease     History reviewed. No pertinent surgical history.  History reviewed. No pertinent family history.  Social History   Social History  . Marital status: Married    Spouse name: N/A  . Number of children: N/A  . Years of education: N/A   Occupational History  . Not on file.   Social History Main Topics  . Smoking status: Never Smoker  . Smokeless tobacco: Never Used  . Alcohol use No  . Drug use: No  . Sexual activity: Yes    Birth control/ protection: None   Other Topics Concern  . Not on file   Social History Narrative  . No narrative on file    . enoxaparin  (LOVENOX) injection  40 mg Subcutaneous Q24H  . methimazole  5 mg Oral QODAY  . oseltamivir  75 mg Oral BID  . prenatal vitamin w/FE, FA  1 tablet Oral Daily    No Known Allergies  Review of Systems - Negative except cough   Exam Vitals:   12/22/16 1245 12/22/16 1400  BP:  102/60  Pulse:  (!) 113  Resp:  (!) 23  Temp: 98.3 F (36.8 C)      Physical Examination: General appearance - alert, well appearing, and in no distress Neck - supple, no significant adenopathy Chest - normal effort Heart - tachy Abdomen - large and gravid, NT Neurological - alert, oriented, normal speech, no focal findings or movement disorder noted Extremities - peripheral pulses normal, no pedal edema, no clubbing or cyanosis Skin - normal coloration and turgor, no rashes, no suspicious skin lesions noted FHR + x 2 today per RROB  Labs:  CBC    Component Value Date/Time   WBC 7.5 12/21/2016 0419   RBC 3.48 (L) 12/21/2016 0419   HGB 10.2 (L) 12/21/2016 0419   HCT 30.7 (L) 12/21/2016 0419   PLT 126 (L) 12/21/2016 0419   MCV 88.2 12/21/2016 0419   MCH 29.3 12/21/2016 0419   MCHC 33.2 12/21/2016 0419   RDW 14.6 12/21/2016 0419   LYMPHSABS 0.6 (L) 12/20/2016 2058   MONOABS 0.7 12/20/2016 2058   EOSABS 0.0 12/20/2016 2058   BASOSABS  0.0 12/20/2016 2058    CMP     Component Value Date/Time   NA 138 12/22/2016 0408   K 3.7 12/22/2016 0408   CL 110 12/22/2016 0408   CO2 21 (L) 12/22/2016 0408   GLUCOSE 90 12/22/2016 0408   BUN 6 12/22/2016 0408   CREATININE 0.47 12/22/2016 0408   CALCIUM 8.3 (L) 12/22/2016 0408   PROT 6.4 (L) 12/20/2016 2058   ALBUMIN 3.0 (L) 12/20/2016 2058   AST 19 12/20/2016 2058   ALT 14 12/20/2016 2058   ALKPHOS 69 12/20/2016 2058   BILITOT 0.5 12/20/2016 2058   GFRNONAA >60 12/22/2016 0408   GFRAA >60 12/22/2016 0408     Radiological Studies Dg Chest 2 View  Result Date: 12/20/2016 CLINICAL DATA:  Cough EXAM: CHEST  2 VIEW COMPARISON:  None. FINDINGS:  Lungs are clear. Heart size and pulmonary vascularity are within normal limits. No adenopathy. No bone lesions. IMPRESSION: No edema or consolidation. Electronically Signed   By: Lowella Grip III M.D.   On: 12/20/2016 21:22   Chart review and patient interview conducted and charting from 16:58-17:41.  Thank you so much for allowing [place] to participate in the care of this patient.  We will continue to follow with you. Please call the attending OB/GYN physician with questions or concerns at [phone].

## 2016-12-22 NOTE — Care Management Note (Signed)
Case Management Note  Patient Details  Name: Raven Martin MRN: [id number] Date of Birth: 01-06-1976  Subjective/Objective:   G5 Para 1 at 7 weeks with twins, lives with spouse,  presents with positive flu A, sepsis, mild acute asthma exacerbation, patient states she has PCP Rachell Cipro on [address].  PTA indep, she has medication coverage and transportation at dc.  NCM will cont to follow for dc needs.                  Action/Plan:   Expected Discharge Date:                  Expected Discharge Plan:  Home/Self Care  In-House Referral:     Discharge planning Services  CM Consult  Post Acute Care Choice:    Choice offered to:     DME Arranged:    DME Agency:     HH Arranged:    HH Agency:     Status of Service:  Completed, signed off  If discussed at [employer] of Stay Meetings, dates discussed:    Additional Comments:  Zenon Mayo, RN 12/22/2016, 10:53 AM

## 2016-12-22 NOTE — Progress Notes (Signed)
PROGRESS NOTE    Raven Martin  [id number] DOB: 02-24-1976 DOA: 12/20/2016 PCP: Senaida Ores, MD   Brief Narrative:  41 y.o.HF PMHX Hyperthyroidism,Asthma, G5P1 031 at 21 weeks. Hyperthyroidism diagnosed during pregnancy;   Presents with complaints of coughand shortness of breath that initially started approximately 3 days ago.She reports having a nonproductive cough for which it was hard for her to catch her breath as though she will have any asthma exacerbation. She reports associated symptoms of chills, generalized body aches, headache, sore throat secondary to coughing, subjective fever. She tried using a albuterol inhaler with only minimal relief of symptoms. Denies having any sinus congestion,vaginal discharge, dysuria, or abdominal pain. She is beingfollowed by [place] [facility] due to advanced maternal age, history of miscarriages and hyperthyroidism as well as [place] [facility].   ED Course:Upon admission to the emergency department patient was seen to have a temperature of 99.44F, pulse elevated to 143, respirations elevated to 36, blood pressure as low as 99/57, and O2 saturationsmaintained on room air. Lab work revealed WBC 9.4, hemoglobin 12.2, platelets 126, potassium 3.3, CO2 21, BUN 5, creatinine 0.45, glucose 108, and lactic acid initially 1.06 (repeat 2.84). Patient was found to be positive for influenza A. Chest x-ray showed no focal infiltrate. She was started on Tamiflu.TRH called as patient less than [redacted] weeks pregnant. OB/GYN to help if needed.   Subjective: 2/15 A/O 4. Continue some positive SOB but significantly improved. Negative CP. Negative abdominal pain.   Assessment & Plan:   Principal Problem:   Sepsis ([place]) Active Problems:   Influenza A   Hyperthyroidism affecting pregnancy   Pregnancy, twins   Hypokalemia   Asthma   High-risk pregnancy in second trimester   Sepsis secondary to Influenza A - lactic acidosis    Continue Tamiflu - lactic acidosis resolved w/ volume  Hypotensive -Continue normal saline 11ml/hr  Tachycardic  -Secondary sepsis  Mild acute asthma exacerbation -Mild wheezing however will not restart steroids -Xopenex  Pregnancy [redacted] weeks pregnant with twins - high risk secondary to advanced maternal age - being followed by [facility] healthcare OB/GYN - OB RR RN to check fetal heart tones Q shift per protocol   Hyperthyroidism Followed by [facility] - continue methimazole - Free T4 is low normal   Hypokalemia Corrected *   DVT prophylaxis: Lovenox Code Status: Full Family Communication:  Disposition Plan: Resolution sepsis   Consultants:  Telephone consult with Dr. Harolyn Rutherford OB/GYN    Procedures/Significant Events:     VENTILATOR SETTINGS:    Cultures   Antimicrobials: Anti-infectives    Start     Stop   12/21/16 1000  oseltamivir (TAMIFLU) capsule 75 mg     12/25/16 2159   12/21/16 0030  oseltamivir (TAMIFLU) capsule 75 mg     12/21/16 0032       Devices    LINES / TUBES:      Continuous Infusions: . sodium chloride 100 mL/hr at 12/22/16 0600     Objective: Vitals:   12/22/16 0400 12/22/16 0600 12/22/16 0700 12/22/16 0744  BP: (!) 91/48 (!) 95/55  (!) 95/55  Pulse: (!) 103 99 (!) 103 (!) 102  Resp: (!) 22 14 18 18   Temp: 98.4 F (36.9 C)  98.1 F (36.7 C)   TempSrc: Oral  Oral   SpO2: 99% 97% 97% 98%  Weight: 65.2 kg (143 lb 11.8 oz)       Intake/Output Summary (Last 24 hours) at 12/22/16 1040 Last data filed at  12/22/16 0600  Gross per 24 hour  Intake             2820 ml  Output             1900 ml  Net              920 ml   Filed Weights   12/20/16 2131 12/22/16 0400  Weight: 65.3 kg (144 lb) 65.2 kg (143 lb 11.8 oz)    Examination:  General: A/O 4, positive acute respiratory distress Eyes: negative scleral hemorrhage, negative anisocoria, negative icterus ENT: Negative Runny nose, negative  gingival bleeding, Neck:  Negative scars, masses, torticollis, lymphadenopathy, JVD Lungs: positive diffuse mild expiratory wheezing, negative crackles, positive nonproductive cough  Cardiovascular: Tachycardic, Regular rhythm without murmur gallop or rub normal S1 and S2 Abdomen: negative abdominal pain, positive distention, positive soft, bowel sounds, no rebound, no ascites, no appreciable mass Extremities: No significant cyanosis, clubbing, or edema bilateral lower extremities Skin: Negative rashes, lesions, ulcers Psychiatric:  Negative depression, negative anxiety, negative fatigue, negative mania  Central nervous system:  Cranial nerves II through XII intact, tongue/uvula midline, all extremities muscle strength 5/5, sensation intact throughout, negative dysarthria, negative expressive aphasia, negative receptive aphasia.  .     Data Reviewed: Care during the described time interval was provided by me .  I have reviewed this patient's available data, including medical history, events of note, physical examination, and all test results as part of my evaluation. I have personally reviewed and interpreted all radiology studies.  CBC:  Recent Labs Lab 12/20/16 2058 12/21/16 0419  WBC 9.4 7.5  NEUTROABS 8.1*  --   HGB 12.2 10.2*  HCT 36.8 30.7*  MCV 87.6 88.2  PLT 126* 123XX123*   Basic Metabolic Panel:  Recent Labs Lab 12/20/16 2058 12/21/16 0419 12/22/16 0408  NA 136 137 138  K 3.3* 4.2 3.7  CL 106 108 110  CO2 21* 20* 21*  GLUCOSE 108* 120* 90  BUN 5* <5* 6  CREATININE 0.45 0.52 0.47  CALCIUM 9.0 8.6* 8.3*   GFR: CrCl cannot be calculated (Unknown ideal weight.). Liver Function Tests:  Recent Labs Lab 12/20/16 2058  AST 19  ALT 14  ALKPHOS 69  BILITOT 0.5  PROT 6.4*  ALBUMIN 3.0*   No results for input(s): LIPASE, AMYLASE in the last 168 hours. No results for input(s): AMMONIA in the last 168 hours. Coagulation Profile: No results for input(s): INR,  PROTIME in the last 168 hours. Cardiac Enzymes: No results for input(s): CKTOTAL, CKMB, CKMBINDEX, TROPONINI in the last 168 hours. BNP (last 3 results) No results for input(s): PROBNP in the last 8760 hours. HbA1C: No results for input(s): HGBA1C in the last 72 hours. CBG: No results for input(s): GLUCAP in the last 168 hours. Lipid Profile: No results for input(s): CHOL, HDL, LDLCALC, TRIG, CHOLHDL, LDLDIRECT in the last 72 hours. Thyroid Function Tests:  Recent Labs  12/20/16 2053  T4TOTAL 8.9  FREET4 0.62   Anemia Panel: No results for input(s): VITAMINB12, FOLATE, FERRITIN, TIBC, IRON, RETICCTPCT in the last 72 hours. Urine analysis:    Component Value Date/Time   COLORURINE YELLOW 12/20/2016 2116   APPEARANCEUR HAZY (A) 12/20/2016 2116   LABSPEC 1.012 12/20/2016 2116   PHURINE 6.0 12/20/2016 2116   GLUCOSEU NEGATIVE 12/20/2016 2116   HGBUR NEGATIVE 12/20/2016 2116   BILIRUBINUR NEGATIVE 12/20/2016 2116   KETONESUR 20 (A) 12/20/2016 2116   PROTEINUR NEGATIVE 12/20/2016 2116   NITRITE NEGATIVE 12/20/2016 2116  LEUKOCYTESUR TRACE (A) 12/20/2016 2116   Sepsis Labs: @LABRCNTIP (procalcitonin:4,lacticidven:4)  ) Recent Results (from the past 240 hour(s))  Blood Culture (routine x 2)     Status: None (Preliminary result)   Collection Time: 12/20/16  8:42 PM  Result Value Ref Range Status   Specimen Description BLOOD LEFT ANTECUBITAL  Final   Special Requests BOTTLES DRAWN AEROBIC ONLY 5CC  Final   Culture NO GROWTH < 24 HOURS  Final   Report Status PENDING  Incomplete  Blood Culture (routine x 2)     Status: None (Preliminary result)   Collection Time: 12/20/16 10:33 PM  Result Value Ref Range Status   Specimen Description BLOOD RIGHT ARM  Final   Special Requests BOTTLES DRAWN AEROBIC AND ANAEROBIC 5ML  Final   Culture NO GROWTH < 24 HOURS  Final   Report Status PENDING  Incomplete  MRSA PCR Screening     Status: None   Collection Time: 12/21/16  3:42 AM    Result Value Ref Range Status   MRSA by PCR NEGATIVE NEGATIVE Final    Comment:        The GeneXpert MRSA Assay (FDA approved for NASAL specimens only), is one component of a comprehensive MRSA colonization surveillance program. It is not intended to diagnose MRSA infection nor to guide or monitor treatment for MRSA infections.          Radiology Studies: Dg Chest 2 View  Result Date: 12/20/2016 CLINICAL DATA:  Cough EXAM: CHEST  2 VIEW COMPARISON:  None. FINDINGS: Lungs are clear. Heart size and pulmonary vascularity are within normal limits. No adenopathy. No bone lesions. IMPRESSION: No edema or consolidation. Electronically Signed   By: Lowella Grip III M.D.   On: 12/20/2016 21:22        Scheduled Meds: . enoxaparin (LOVENOX) injection  40 mg Subcutaneous Q24H  . methimazole  5 mg Oral QODAY  . oseltamivir  75 mg Oral BID  . prenatal vitamin w/FE, FA  1 tablet Oral Daily   Continuous Infusions: . sodium chloride 100 mL/hr at 12/22/16 0600     LOS: 1 day    Time spent: 40 minutes    Neima Lacross, Geraldo Docker, MD [facility] Pager [phone]   If 7PM-7AM, please contact night-coverage [email or web address] Password [email or web address] 12/22/2016, 10:40 AM

## 2016-12-22 NOTE — Progress Notes (Signed)
Interpreter Lesle Chris for [facility] managment

## 2016-12-23 DIAGNOSIS — O30049 Twin pregnancy, dichorionic/diamniotic, unspecified trimester: Secondary | ICD-10-CM

## 2016-12-23 MED ORDER — OSELTAMIVIR PHOSPHATE 75 MG PO CAPS: 75.0000 mg | ORAL_CAPSULE | Freq: Two times a day (BID) | ORAL | 0 refills | Status: DC

## 2016-12-23 NOTE — Progress Notes (Signed)
Discharge instructions given to patient all questions answered at this time.  Prescription for Tamiflu given to patient.  Pt. VSS with no s/s of distress noted.  Patient stable at discharge.

## 2016-12-23 NOTE — Discharge Summary (Signed)
DISCHARGE SUMMARY  Raven Martin  MR#: [id number]  DOB:08/31/1976  Date of Admission: 12/20/2016 Date of Discharge: 12/23/2016  Attending Physician:Clarabelle Oscarson T  Patient's HO:[id number], MD  Consults:  Disposition: D/C home   Follow-up Appts: Follow-up Information    ARUS,ARIEL, MD. Schedule an appointment as soon as possible for a visit in 5 day(s).   Specialty:  Obstetrics and Gynecology Contact information: [facility] [place] [address] [phone]            Discharge Diagnoses: Sepsis secondary to Influenza A w/ lactic acidosis  Mild acute asthma exacerbation Pregnancy Hyperthyroidism Hypokalemia  Initial presentation: 41 y.o.femaleG5P1 at 21 weekswith history significant for asthma and hypothyroidism diagnosed during pregnancy who presented with complaints of coughand shortness of breath for 3 days.She tried using a albuterol inhaler with only minimal relief of symptoms. She deniedvaginal discharge, dysuria, or abdominal pain.   Upon admission to the emergency department she was noted to have a temperature of 99.48F, pulse of 143, respirations 36, and BP 99/57.  Patient was found to be positive for influenza A. Chest x-ray showed no focal infiltrate. She was started on Tamiflu.  Hospital Course:  Sepsis secondary to Influenza A - lactic acidosis  Continue Tamiflu to complete 10 doses - lactic acidosis resolved w/ volume resuscitation  Mild acute asthma exacerbation No more wheezing - steroids stopped prior to d/c - respirations normal at time of d/c   Pregnancy [redacted] weeks pregnant with twins - high risk secondary to advanced maternal age - being followed by [facility] healthcare OB/GYN - OB RR RN was utlized to check fetal heart tones per protocol - OB evaluated pt on 2/15 and cleared for d/c from OB standpoint   Hyperthyroidism Followed by [facility] - continue methimazole -  Free T4 is low normal   Hypokalemia Corrected prior to d/c    Allergies as of 12/23/2016   No Known Allergies     Medication List    TAKE these medications   acetaminophen 500 MG tablet Commonly known as:  TYLENOL Take 500 mg by mouth every 6 (six) hours as needed for headache.   methimazole 5 MG tablet Commonly known as:  TAPAZOLE Take 5 mg by mouth every other day.   oseltamivir 75 MG capsule Commonly known as:  TAMIFLU Take 1 capsule (75 mg total) by mouth 2 (two) times daily.   prenatal multivitamin Tabs tablet Take 1 tablet by mouth daily.   PROAIR HFA 108 (90 Base) MCG/ACT inhaler Generic drug:  albuterol Inhale 2 puffs into the lungs every 4 (four) hours as needed for wheezing or shortness of breath.       Day of Discharge BP 103/61   Pulse (!) 112   Temp 99.6 F (37.6 C) (Oral)   Resp (!) 21   Wt 65.2 kg (143 lb 11.8 oz)   SpO2 99%   Physical Exam: General: No acute respiratory distress Lungs: Clear to auscultation bilaterally without wheezes or crackles Cardiovascular: Regular rate and rhythm without murmur gallop or rub normal S1 and S2 Abdomen: Nontender, nondistended, soft, bowel sounds positive, no rebound, no ascites, no appreciable mass Extremities: No significant cyanosis, clubbing, or edema bilateral lower extremities  Basic Metabolic Panel:  Recent Labs Lab 12/20/16 2058 12/21/16 0419 12/22/16 0408  NA 136 137 138  K 3.3* 4.2 3.7  CL 106 108 110  CO2 21* 20* 21*  GLUCOSE 108* 120* 90  BUN 5* <5* 6  CREATININE 0.45 0.52  0.47  CALCIUM 9.0 8.6* 8.3*    Liver Function Tests:  Recent Labs Lab 12/20/16 2058  AST 19  ALT 14  ALKPHOS 69  BILITOT 0.5  PROT 6.4*  ALBUMIN 3.0*    CBC:  Recent Labs Lab 12/20/16 2058 12/21/16 0419  WBC 9.4 7.5  NEUTROABS 8.1*  --   HGB 12.2 10.2*  HCT 36.8 30.7*  MCV 87.6 88.2  PLT 126* 126*    Recent Results (from the past 240 hour(s))  Blood Culture (routine x 2)     Status: None  (Preliminary result)   Collection Time: 12/20/16  8:42 PM  Result Value Ref Range Status   Specimen Description BLOOD LEFT ANTECUBITAL  Final   Special Requests BOTTLES DRAWN AEROBIC ONLY 5CC  Final   Culture NO GROWTH 3 DAYS  Final   Report Status PENDING  Incomplete  Blood Culture (routine x 2)     Status: None (Preliminary result)   Collection Time: 12/20/16 10:33 PM  Result Value Ref Range Status   Specimen Description BLOOD RIGHT ARM  Final   Special Requests BOTTLES DRAWN AEROBIC AND ANAEROBIC 5ML  Final   Culture NO GROWTH 3 DAYS  Final   Report Status PENDING  Incomplete  MRSA PCR Screening     Status: None   Collection Time: 12/21/16  3:42 AM  Result Value Ref Range Status   MRSA by PCR NEGATIVE NEGATIVE Final    Comment:        The GeneXpert MRSA Assay (FDA approved for NASAL specimens only), is one component of a comprehensive MRSA colonization surveillance program. It is not intended to diagnose MRSA infection nor to guide or monitor treatment for MRSA infections.      Time spent in discharge (includes decision making & examination of pt): < 30 minutes  12/23/2016, 4:38 PM   Cherene Altes, MD [facility] Office  [phone] Pager [phone]  On-Call/Text Page:      [email or web address]      password [facility]

## 2016-12-23 NOTE — Discharge Instructions (Signed)
Gripe en los adultos °(Influenza, Adult) °La gripe es una infección viral que afecta principalmente las vías respiratorias. Las vías respiratorias incluyen órganos que ayudan a respirar, como los pulmones, la nariz y la garganta. La gripe provoca muchos síntomas del resfrío común, así como fiebre alta y dolor corporal. °Se transmite fácilmente de persona a persona (es contagiosa). La mejor manera de prevenir la gripe es aplicándose la vacuna contra la gripe todos los años. °CAUSAS °La gripe es causada por un virus. Se puede contagiar el virus de las siguientes maneras: °· Al aspirar las gotitas que una persona infectada elimina al toser o estornudar. °· Al tocar algo que fue recientemente contaminado con el virus y luego llevarse la mano a la boca, la nariz o los ojos. °FACTORES DE RIESGO °Los siguientes factores pueden hacer que usted sea propenso a contraer la gripe: °· No lavarse las manos frecuentemente con agua y jabón o un desinfectante de manos a base de alcohol. °· Tener contacto cercano con muchas personas durante la temporada de resfrío y gripe. °· Tocarse la boca, los ojos o la nariz sin lavarse ni desinfectarse las manos primero. °· No beber suficientes líquidos o no seguir una dieta saludable. °· No dormir lo suficiente o no hacer suficiente actividad física. °· Tener un alto grado de estrés. °· No colocarse la vacuna anual contra la gripe. °Puede correr un mayor riesgo de complicaciones de la gripe, como una infección pulmonar grave (neumonía) si: °· Tiene más de 65 años. °· Está embarazada. °· Tiene un sistema que combate las defensas (sistema inmunitario) debilitado. Puede tener un sistema inmunitario debilitado si: °? Tiene VIH o sida. °? Está recibiendo quimioterapia. °? Usa medicamentos que reducen la actividad (suprimen) del sistema inmunitario. °· Tiene una enfermedad a largo plazo (crónica), como cardiopatía coronaria, enfermedad renal, diabetes o enfermedad pulmonar. °· Tiene un trastorno  hepático. °· Son obesas. °· Tiene anemia. °SÍNTOMAS °Los síntomas de esta afección por lo general duran entre 4 y 10 días, y pueden tener los siguientes síntomas: °· Fiebre. °· Escalofríos. °· Dolor de cabeza, dolores en el cuerpo o dolores musculares. °· Dolor de garganta. °· Tos. °· Secreción o congestión nasal. °· Molestias en el pecho y tos. °· Pérdida del apetito. °· Debilidad o cansancio (fatiga). °· Mareos. °· Náuseas o vómitos. °DIAGNÓSTICO °Esta afección se puede diagnosticar en función de los antecedentes médicos y un examen físico. El médico puede indicarle un cultivo faríngeo o nasal para confirmar el diagnóstico. °TRATAMIENTO °Si la gripe se detecta de manera temprana, puede recibir tratamiento con medicamentos antivirales que pueden reducir la duración de la enfermedad y la gravedad de los síntomas. Este medicamento se puede administrar por vía oral o por vía intravenosa (IV), es decir, a través de un tubo que se coloca en una vena. °El objetivo del tratamiento es aliviar los síntomas cuidándose en su casa. Esto puede incluir usar medicamentos de venta libre, beber una gran cantidad de líquidos y agregar humedad al aire en su casa. °En algunos casos, la gripe se cura sin tratamiento. Los casos graves o las complicaciones de gripe se pueden tratar en un hospital. °INSTRUCCIONES PARA EL CUIDADO EN EL HOGAR °· Tome los medicamentos de venta libre y los recetados solamente como se lo haya indicado el médico. °· Use un humidificador de aire frío para agregar humedad al aire de su casa. Esto puede ayudarlo a respirar mejor. °· Descanse todo lo que sea necesario. °· Beba suficiente líquido para mantener la orina clara o de color   amarillo pálido. °· Al toser o estornudar, cúbrase la boca y la nariz. °· Lávese las manos con agua y jabón frecuentemente, en especial después de toser o estornudar. Use desinfectante para manos si no dispone de agua y jabón. °· Quédese en su casa y no concurra al trabajo o a la  escuela, como se lo haya indicado el médico. A menos que visite al médico, evite salir de su casa hasta que no tenga fiebre durante 24 horas sin el uso de medicamentos. °· Concurra a todas las visitas de control como se lo haya indicado el médico. Esto es importante. ° °PREVENCIÓN °· Aplicarse la vacuna anual contra la gripe es la mejor manera de evitar contagiarse la gripe. Puede aplicarse la vacuna contra la gripe a fines de verano, en otoño o en invierno. Pregúntele al médico cuándo debe aplicarse la vacuna contra la gripe. °· Lávese las manos o use un desinfectante de manos con frecuencia. °· Evite el contacto con personas que están enfermas durante la temporada de resfrío y gripe. °· Siga una dieta saludable, beba muchos líquidos, duerma lo suficiente y realice actividad física con regularidad. ° °SOLICITE ATENCIÓN MÉDICA SI: °· Presenta nuevos síntomas. °· Tiene los siguientes síntomas: °? Dolor en el pecho. °? Diarrea. °? Fiebre. °· La tos empeora. °· Produce más mucosidad. °· Siente náuseas o vomita. ° °SOLICITE ATENCIÓN MÉDICA DE INMEDIATO SI: °· Le falta el aire o tiene dificultad para respirar. °· La piel o las uñas se tornan de un color azulado. °· Presenta dolor intenso o rigidez de cuello. °· Le duele la cabeza de forma repentina o tiene dolor en la cara o el oído. °· No puede dejar de vomitar. ° °Esta información no tiene como fin reemplazar el consejo del médico. Asegúrese de hacerle al médico cualquier pregunta que tenga. °Document Released: 08/03/2005 Document Revised: 02/15/2016 Document Reviewed: 08/18/2015 °Elsevier Interactive Patient Education © 2017 [employer] ° °

## 2016-12-25 LAB — CULTURE, BLOOD (ROUTINE X 2)
Culture: NO GROWTH
Culture: NO GROWTH

## 2017-01-30 DIAGNOSIS — Z9851 Tubal ligation status: Secondary | ICD-10-CM | POA: Insufficient documentation

## 2017-03-14 DIAGNOSIS — J4520 Mild intermittent asthma, uncomplicated: Secondary | ICD-10-CM | POA: Insufficient documentation

## 2017-03-14 DIAGNOSIS — Z8639 Personal history of other endocrine, nutritional and metabolic disease: Secondary | ICD-10-CM | POA: Insufficient documentation

## 2017-03-28 DIAGNOSIS — F53 Postpartum depression: Secondary | ICD-10-CM | POA: Insufficient documentation

## 2017-08-03 ENCOUNTER — Other Ambulatory Visit: Payer: Self-pay | Admitting: Family Medicine

## 2017-08-03 DIAGNOSIS — Z1231 Encounter for screening mammogram for malignant neoplasm of breast: Secondary | ICD-10-CM

## 2017-11-15 ENCOUNTER — Ambulatory Visit
Admission: RE | Admit: 2017-11-15 | Discharge: 2017-11-15 | Disposition: A | Discharge status: Home / Self Care | Payer: BC Managed Care – PPO | Source: Ambulatory Visit | Attending: Family Medicine | Admitting: Family Medicine

## 2017-11-15 DIAGNOSIS — Z1231 Encounter for screening mammogram for malignant neoplasm of breast: Secondary | ICD-10-CM

## 2019-09-09 ENCOUNTER — Other Ambulatory Visit: Payer: Self-pay

## 2019-09-09 DIAGNOSIS — Z20822 Contact with and (suspected) exposure to covid-19: Secondary | ICD-10-CM

## 2019-09-10 LAB — NOVEL CORONAVIRUS, NAA: SARS-CoV-2, NAA: NOT DETECTED

## 2019-09-30 ENCOUNTER — Other Ambulatory Visit: Payer: Self-pay

## 2019-09-30 DIAGNOSIS — Z20822 Contact with and (suspected) exposure to covid-19: Secondary | ICD-10-CM

## 2019-10-02 LAB — NOVEL CORONAVIRUS, NAA: SARS-CoV-2, NAA: NOT DETECTED

## 2020-08-21 ENCOUNTER — Other Ambulatory Visit: Payer: Self-pay | Admitting: Family Medicine

## 2020-08-21 DIAGNOSIS — Z1231 Encounter for screening mammogram for malignant neoplasm of breast: Secondary | ICD-10-CM

## 2020-09-03 ENCOUNTER — Ambulatory Visit
Admission: RE | Admit: 2020-09-03 | Discharge: 2020-09-03 | Disposition: A | Discharge status: Home / Self Care | Payer: BC Managed Care – PPO | Source: Ambulatory Visit | Attending: Family Medicine | Admitting: Family Medicine

## 2020-09-03 ENCOUNTER — Other Ambulatory Visit: Payer: Self-pay

## 2020-09-03 DIAGNOSIS — Z1231 Encounter for screening mammogram for malignant neoplasm of breast: Secondary | ICD-10-CM

## 2020-09-08 ENCOUNTER — Other Ambulatory Visit: Payer: Self-pay | Admitting: Family Medicine

## 2020-09-08 DIAGNOSIS — R928 Other abnormal and inconclusive findings on diagnostic imaging of breast: Secondary | ICD-10-CM

## 2020-09-19 ENCOUNTER — Ambulatory Visit: Payer: BC Managed Care – PPO

## 2020-09-19 ENCOUNTER — Ambulatory Visit
Admission: RE | Admit: 2020-09-19 | Discharge: 2020-09-19 | Disposition: A | Discharge status: Home / Self Care | Payer: BC Managed Care – PPO | Source: Ambulatory Visit | Attending: Family Medicine | Admitting: Family Medicine

## 2020-09-19 ENCOUNTER — Other Ambulatory Visit: Payer: Self-pay

## 2020-09-19 DIAGNOSIS — R928 Other abnormal and inconclusive findings on diagnostic imaging of breast: Secondary | ICD-10-CM

## 2021-08-26 ENCOUNTER — Other Ambulatory Visit: Payer: Self-pay | Admitting: Family Medicine

## 2021-08-26 DIAGNOSIS — Z1231 Encounter for screening mammogram for malignant neoplasm of breast: Secondary | ICD-10-CM

## 2021-09-11 ENCOUNTER — Ambulatory Visit
Admission: RE | Admit: 2021-09-11 | Discharge: 2021-09-11 | Disposition: A | Discharge status: Home / Self Care | Payer: Medicaid Other | Source: Ambulatory Visit | Attending: Family Medicine | Admitting: Family Medicine

## 2021-09-11 DIAGNOSIS — Z1231 Encounter for screening mammogram for malignant neoplasm of breast: Secondary | ICD-10-CM

## 2022-08-16 ENCOUNTER — Other Ambulatory Visit: Payer: Self-pay | Admitting: Family Medicine

## 2022-08-16 DIAGNOSIS — Z1231 Encounter for screening mammogram for malignant neoplasm of breast: Secondary | ICD-10-CM

## 2022-09-15 ENCOUNTER — Ambulatory Visit: Payer: BC Managed Care – PPO

## 2022-10-20 ENCOUNTER — Ambulatory Visit (AMBULATORY_SURGERY_CENTER): Payer: BC Managed Care – PPO

## 2022-10-20 VITALS — Ht 61.0 in | Wt 133.0 lb

## 2022-10-20 DIAGNOSIS — Z1211 Encounter for screening for malignant neoplasm of colon: Secondary | ICD-10-CM

## 2022-10-20 MED ORDER — NA SULFATE-K SULFATE-MG SULF 17.5-3.13-1.6 GM/177ML PO SOLN: 1.0000 | Freq: Once | ORAL | 0 refills | Status: AC

## 2022-10-20 NOTE — Progress Notes (Signed)

## 2022-11-10 ENCOUNTER — Encounter: Payer: Self-pay | Admitting: Gastroenterology

## 2022-11-11 ENCOUNTER — Ambulatory Visit
Admission: RE | Admit: 2022-11-11 | Discharge: 2022-11-11 | Disposition: A | Discharge status: Home / Self Care | Payer: BC Managed Care – PPO | Source: Ambulatory Visit | Attending: Family Medicine | Admitting: Family Medicine

## 2022-11-11 DIAGNOSIS — Z1231 Encounter for screening mammogram for malignant neoplasm of breast: Secondary | ICD-10-CM

## 2022-11-12 ENCOUNTER — Encounter: Payer: Self-pay | Admitting: Certified Registered Nurse Anesthetist

## 2022-11-14 ENCOUNTER — Encounter: Payer: Self-pay | Admitting: Gastroenterology

## 2022-11-14 ENCOUNTER — Encounter: Payer: BC Managed Care – PPO | Admitting: Gastroenterology

## 2022-11-14 ENCOUNTER — Ambulatory Visit (AMBULATORY_SURGERY_CENTER): Payer: BC Managed Care – PPO | Admitting: Gastroenterology

## 2022-11-14 VITALS — BP 111/69 | HR 64 | Temp 98.4°F | Resp 18 | Ht 61.0 in | Wt 133.0 lb

## 2022-11-14 DIAGNOSIS — D123 Benign neoplasm of transverse colon: Secondary | ICD-10-CM | POA: Diagnosis not present

## 2022-11-14 DIAGNOSIS — D122 Benign neoplasm of ascending colon: Secondary | ICD-10-CM

## 2022-11-14 DIAGNOSIS — Z1211 Encounter for screening for malignant neoplasm of colon: Secondary | ICD-10-CM | POA: Diagnosis not present

## 2022-11-14 MED ORDER — SODIUM CHLORIDE 0.9 % IV SOLN: 500.0000 mL | Freq: Once | INTRAVENOUS | Status: DC

## 2022-11-14 NOTE — Patient Instructions (Signed)
REad all of the handouts given to you by your recovery room nurse.  Resume all of your previous medications.  YOU HAD AN ENDOSCOPIC PROCEDURE TODAY AT [facility]:   Refer to the procedure report that was given to you for any specific questions about what was found during the examination.  If the procedure report does not answer your questions, please call your gastroenterologist to clarify.  If you requested that your care partner not be given the details of your procedure findings, then the procedure report has been included in a sealed envelope for you to review at your convenience later.  YOU SHOULD EXPECT: Some feelings of bloating in the abdomen. Passage of more gas than usual.  Walking can help get rid of the air that was put into your GI tract during the procedure and reduce the bloating. If you had a lower endoscopy (such as a colonoscopy or flexible sigmoidoscopy) you may notice spotting of blood in your stool or on the toilet paper. If you underwent a bowel prep for your procedure, you may not have a normal bowel movement for a few days.  Please Note:  You might notice some irritation and congestion in your nose or some drainage.  This is from the oxygen used during your procedure.  There is no need for concern and it should clear up in a day or so.  SYMPTOMS TO REPORT IMMEDIATELY:  Following lower endoscopy (colonoscopy or flexible sigmoidoscopy):  Excessive amounts of blood in the stool  Significant tenderness or worsening of abdominal pains  Swelling of the abdomen that is new, acute  Fever of 100F or higher   For urgent or emergent issues, a gastroenterologist can be reached at any hour by calling [phone]. Do not use MyChart messaging for urgent concerns.    DIET:  We do recommend a small meal at first, but then you may proceed to your regular diet.  Drink plenty of fluids but you should avoid alcoholic beverages for 24 hours.  ACTIVITY:  You should plan  to take it easy for the rest of today and you should NOT DRIVE or use heavy machinery until tomorrow (because of the sedation medicines used during the test).    FOLLOW UP: Our staff will call the number listed on your records the next business day following your procedure.  We will call around 7:15- 8:00 am to check on you and address any questions or concerns that you may have regarding the information given to you following your procedure. If we do not reach you, we will leave a message.     If any biopsies were taken you will be contacted by phone or by letter within the next 1-3 weeks.  Please call us at [phone] if you have not heard about the biopsies in 3 weeks.    SIGNATURES/CONFIDENTIALITY: You and/or your care partner have signed paperwork which will be entered into your electronic medical record.  These signatures attest to the fact that that the information above on your After Visit Summary has been reviewed and is understood.  Full responsibility of the confidentiality of this discharge information lies with you and/or your care-partner.

## 2022-11-14 NOTE — Op Note (Signed)
[facility] Patient Name: Raven Martin Procedure Date: 11/14/2022 10:53 AM MRN: [id number] Endoscopist: Mauri Pole , MD, [id number] Age: 47 Referring MD:  Date of Birth: [birth year] Gender: Female Account #: [id number] Procedure:                Colonoscopy Indications:              Screening for colorectal malignant neoplasm Medicines:                Monitored Anesthesia Care Procedure:                Pre-Anesthesia Assessment:                           - Prior to the procedure, a History and Physical                            was performed, and patient medications and                            allergies were reviewed. The patient's tolerance of                            previous anesthesia was also reviewed. The risks                            and benefits of the procedure and the sedation                            options and risks were discussed with the patient.                            All questions were answered, and informed consent                            was obtained. Prior Anticoagulants: The patient has                            taken no anticoagulant or antiplatelet agents. ASA                            Grade Assessment: II - A patient with mild systemic                            disease. After reviewing the risks and benefits,                            the patient was deemed in satisfactory condition to                            undergo the procedure.                           After obtaining informed consent, the colonoscope  was passed under direct vision. Throughout the                            procedure, the patient's blood pressure, pulse, and                            oxygen saturations were monitored continuously. The                            PCF-HQ190L Colonoscope was introduced through the                            anus and advanced to the the cecum, identified by                             appendiceal orifice and ileocecal valve. The                            colonoscopy was performed without difficulty. The                            patient tolerated the procedure well. The quality                            of the bowel preparation was good. The ileocecal                            valve, appendiceal orifice, and rectum were                            photographed. Scope In: 11:04:58 AM Scope Out: 11:17:57 AM Scope Withdrawal Time: 0 hours 9 minutes 57 seconds  Total Procedure Duration: 0 hours 12 minutes 59 seconds  Findings:                 The perianal and digital rectal examinations were                            normal.                           Two sessile polyps were found in the transverse                            colon and ascending colon. The polyps were 3 to 4                            mm in size. These polyps were removed with a cold                            snare. Resection and retrieval were complete.                           Non-bleeding internal hemorrhoids were found during  retroflexion. The hemorrhoids were medium-sized. Complications:            No immediate complications. Estimated Blood Loss:     Estimated blood loss was minimal. Impression:               - Two 3 to 4 mm polyps in the transverse colon and                            in the ascending colon, removed with a cold snare.                            Resected and retrieved.                           - Non-bleeding internal hemorrhoids. Recommendation:           - Patient has a contact number available for                            emergencies. The signs and symptoms of potential                            delayed complications were discussed with the                            patient. Return to normal activities tomorrow.                            Written discharge instructions were provided to the                            patient.                            - Resume previous diet.                           - Continue present medications.                           - Await pathology results.                           - Repeat colonoscopy in 5-10 years for surveillance                            based on pathology results. Mauri Pole, MD 11/14/2022 11:24:43 AM This report has been signed electronically.

## 2022-11-14 NOTE — Progress Notes (Unsigned)
[facility] History and Physical   Primary Care Physician:  Fanny Bien, MD   Reason for Procedure:  Colorectal cancer screening  Plan:    Screening colonoscopy with possible interventions as needed     HPI: Raven Martin is a very pleasant 47 y.o. female here for screening colonoscopy. Denies any nausea, vomiting, abdominal pain, melena or bright red blood per rectum  The risks and benefits as well as alternatives of endoscopic procedure(s) have been discussed and reviewed. All questions answered. The patient agrees to proceed.    Past Medical History:  Diagnosis Date   Asthma    Thyroid disease     History reviewed. No pertinent surgical history.  Prior to Admission medications   Medication Sig Start Date End Date Taking? Authorizing Provider  acetaminophen (TYLENOL) 500 MG tablet Take 500 mg by mouth every 6 (six) hours as needed for headache. Patient not taking: Reported on 10/20/2022    [provider]  albuterol (PROAIR HFA) 108 (90 Base) MCG/ACT inhaler Inhale 2 puffs into the lungs every 4 (four) hours as needed for wheezing or shortness of breath.  09/07/16   [provider]  methimazole (TAPAZOLE) 5 MG tablet Take 5 mg by mouth every other day. 11/28/16 11/28/17  [provider]    Current Outpatient Medications  Medication Sig Dispense Refill   acetaminophen (TYLENOL) 500 MG tablet Take 500 mg by mouth every 6 (six) hours as needed for headache. (Patient not taking: Reported on 10/20/2022)     albuterol (PROAIR HFA) 108 (90 Base) MCG/ACT inhaler Inhale 2 puffs into the lungs every 4 (four) hours as needed for wheezing or shortness of breath.      methimazole (TAPAZOLE) 5 MG tablet Take 5 mg by mouth every other day.     Current Facility-Administered Medications  Medication Dose Route Frequency Provider Last Rate Last Admin   0.9 %  sodium chloride infusion  500 mL Intravenous Once Mauri Pole, MD         Allergies as of 11/14/2022   (No Known Allergies)    Family History  Problem Relation Age of Onset   Colon cancer Paternal Aunt    Colon polyps Paternal Aunt    Breast cancer Neg Hx    Esophageal cancer Neg Hx    Rectal cancer Neg Hx    Stomach cancer Neg Hx     Social History   Socioeconomic History   Marital status: Married    Spouse name: Not on file   Number of children: Not on file   Years of education: Not on file   Highest education level: Not on file  Occupational History   Not on file  Tobacco Use   Smoking status: Never   Smokeless tobacco: Never  Substance and Sexual Activity   Alcohol use: No   Drug use: No   Sexual activity: Yes    Birth control/protection: None  Other Topics Concern   Not on file  Social History Narrative   Not on file   Social Determinants of Health   Financial Resource Strain: Not on file  Food Insecurity: Not on file  Transportation Needs: Not on file  Physical Activity: Not on file  Stress: Not on file  Social Connections: Not on file  Intimate Partner Violence: Not on file    Review of Systems:  All other review of systems negative except as mentioned in the HPI.  Physical Exam: Vital signs in last 24 hours: Blood Pressure  123/85   Pulse 65   Temperature 98.4 F (36.9 C) (Temporal)   Respiration 20   Height '5\' 1"'$  (1.549 m)   Weight 133 lb (60.3 kg)   Last Menstrual Period 11/14/2022 (Exact Date)   Oxygen Saturation 100%   Body Mass Index 25.13 kg/m  General:   Alert, NAD Lungs:  Clear .   Heart:  Regular rate and rhythm Abdomen:  Soft, nontender and nondistended. Neuro/Psych:  Alert and cooperative. Normal mood and affect. A and O x 3  Reviewed labs, radiology imaging, old records and pertinent past GI work up  Patient is appropriate for planned procedure(s) and anesthesia in an ambulatory setting   K. Denzil Magnuson , MD [phone]

## 2022-11-14 NOTE — Progress Notes (Unsigned)
Pt's states no medical or surgical changes since previsit or office visit. 

## 2022-11-14 NOTE — Progress Notes (Signed)
Report given to PACU, vss 

## 2022-11-15 ENCOUNTER — Telehealth: Payer: Self-pay | Admitting: *Deleted

## 2022-11-15 NOTE — Telephone Encounter (Signed)
  Follow up Call-     11/14/2022   10:27 AM  Call back number  Post procedure Call Back phone  # [phone]  Permission to leave phone message Yes     Patient questions:  Do you have a fever, pain , or abdominal swelling? No. Pain Score  0 *  Have you tolerated food without any problems? Yes.    Have you been able to return to your normal activities? Yes.    Do you have any questions about your discharge instructions: Diet   No. Medications  No. Follow up visit  No.  Do you have questions or concerns about your Care? No.  Actions: * If pain score is 4 or above: No action needed, pain <4.

## 2022-11-24 ENCOUNTER — Encounter: Payer: Self-pay | Admitting: Gastroenterology

## 2023-02-08 IMAGING — MG MM DIGITAL SCREENING BILAT W/ TOMO AND CAD
8 series · 8 of 24 positions shown · non-contrast
Comparison: Previous exam(s).

CLINICAL DATA: Screening.

EXAM:
DIGITAL SCREENING BILATERAL MAMMOGRAM WITH TOMOSYNTHESIS AND CAD
TECHNIQUE: Bilateral screening digital craniocaudal and mediolateral oblique
mammograms were obtained. Bilateral screening digital breast
tomosynthesis was performed. The images were evaluated with
computer-aided detection.

[L CC synth-2D]
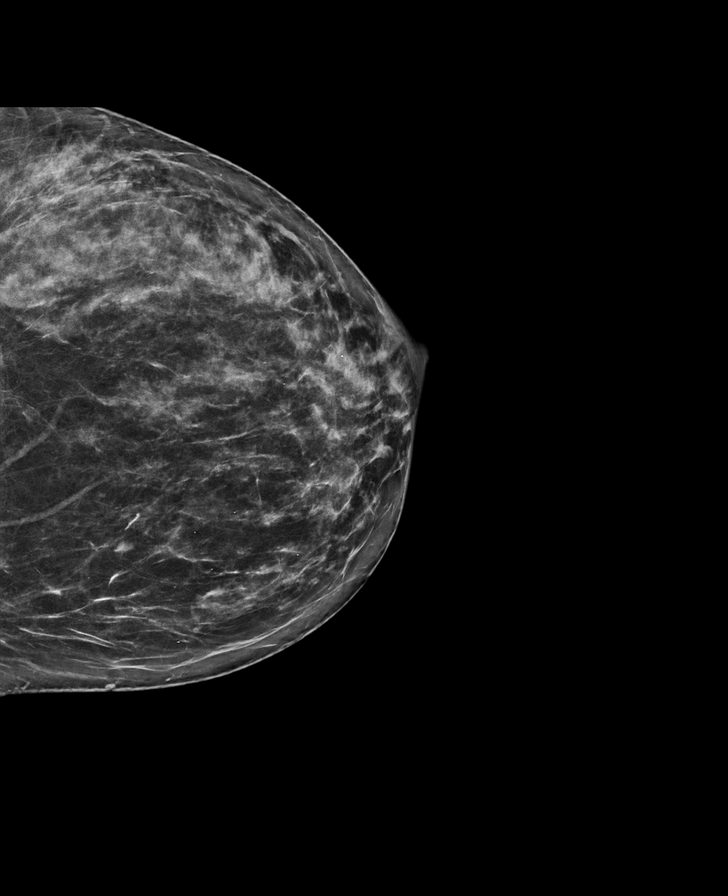

[R CC synth-2D]
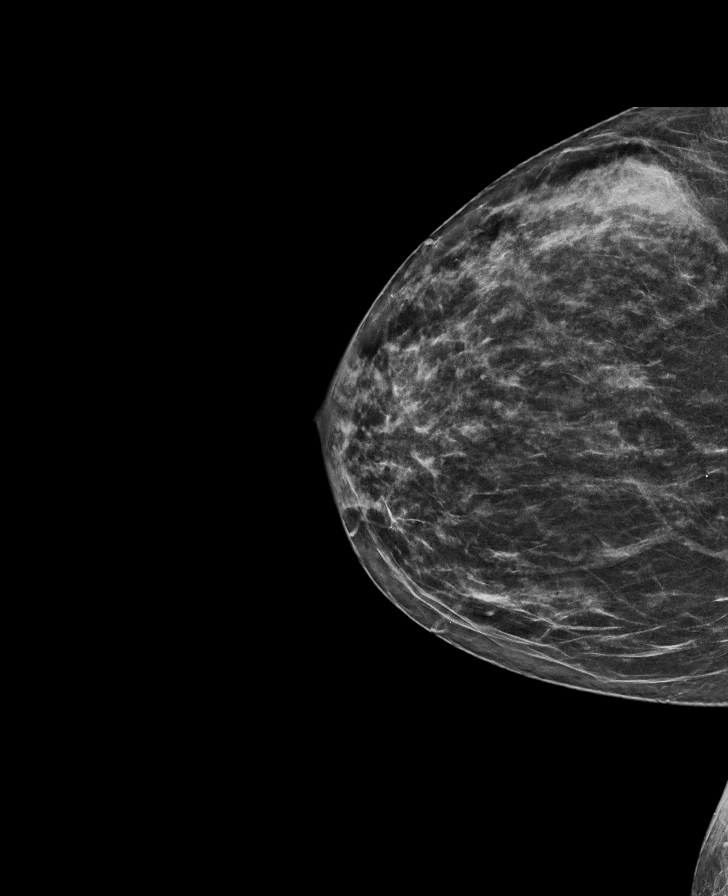

[L MLO synth-2D]
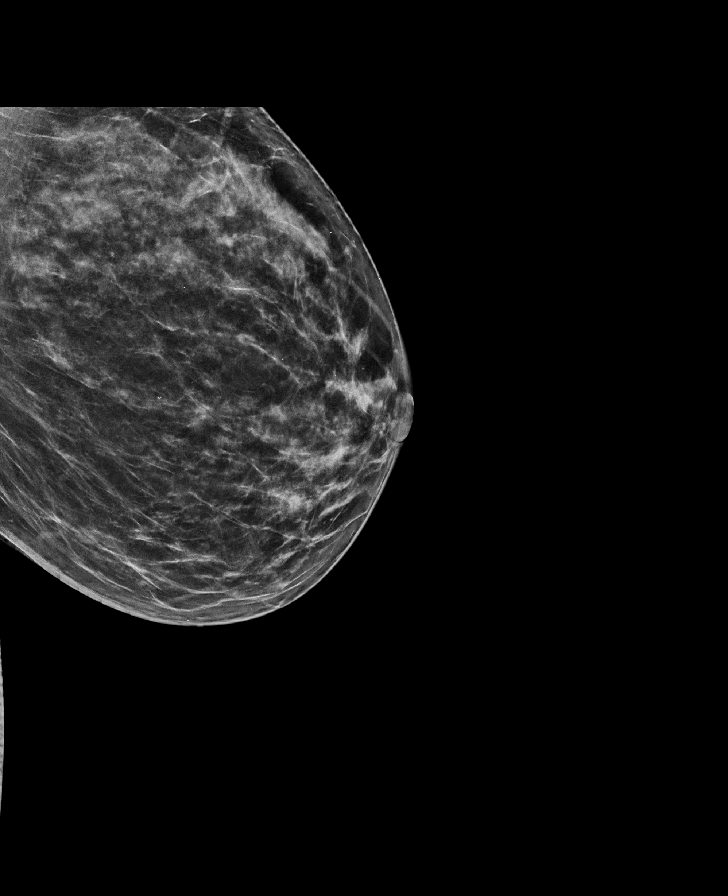

[R MLO synth-2D]
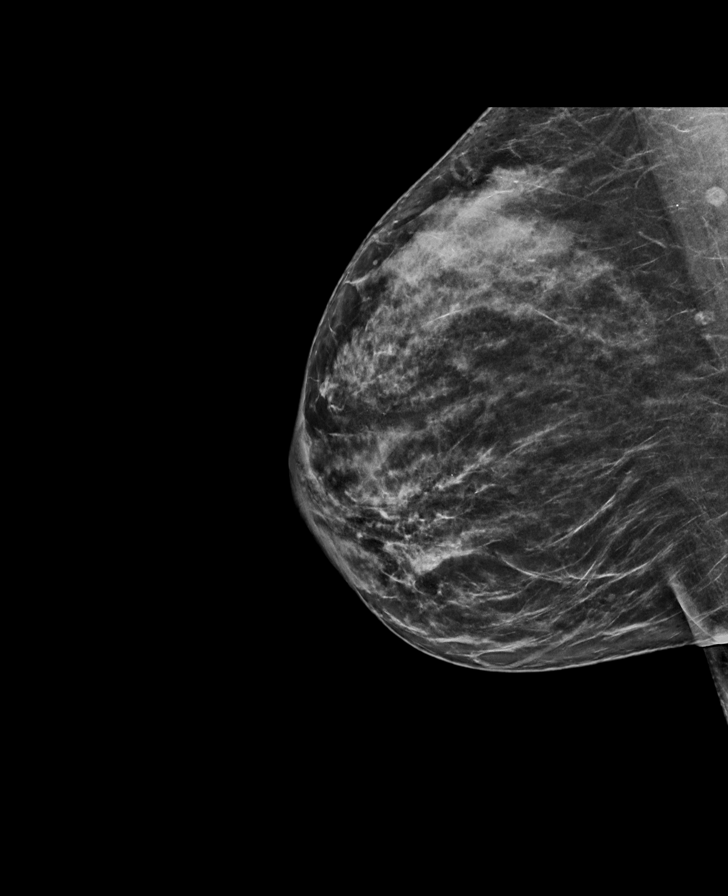

[R CC tomo · tomo slice 34/67.0]
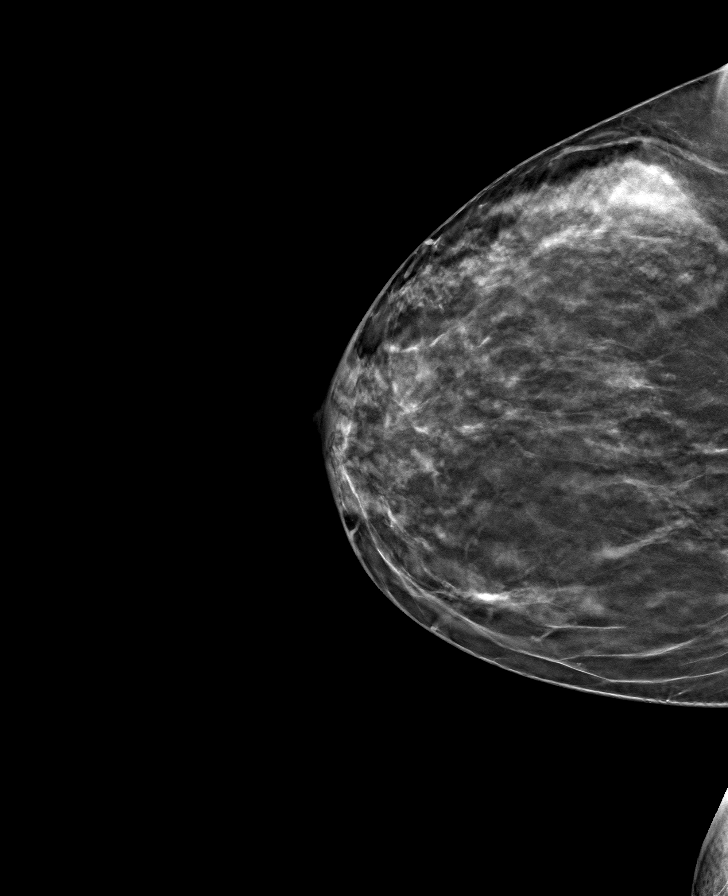

[L CC tomo · tomo slice 37/72.0]
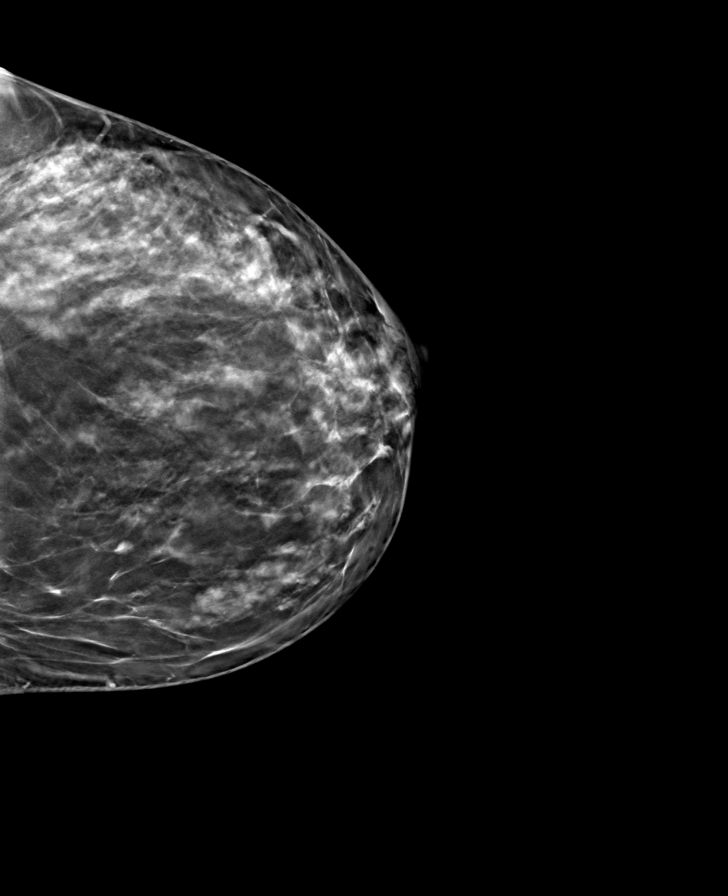

[R MLO tomo · tomo slice 37/72.0]
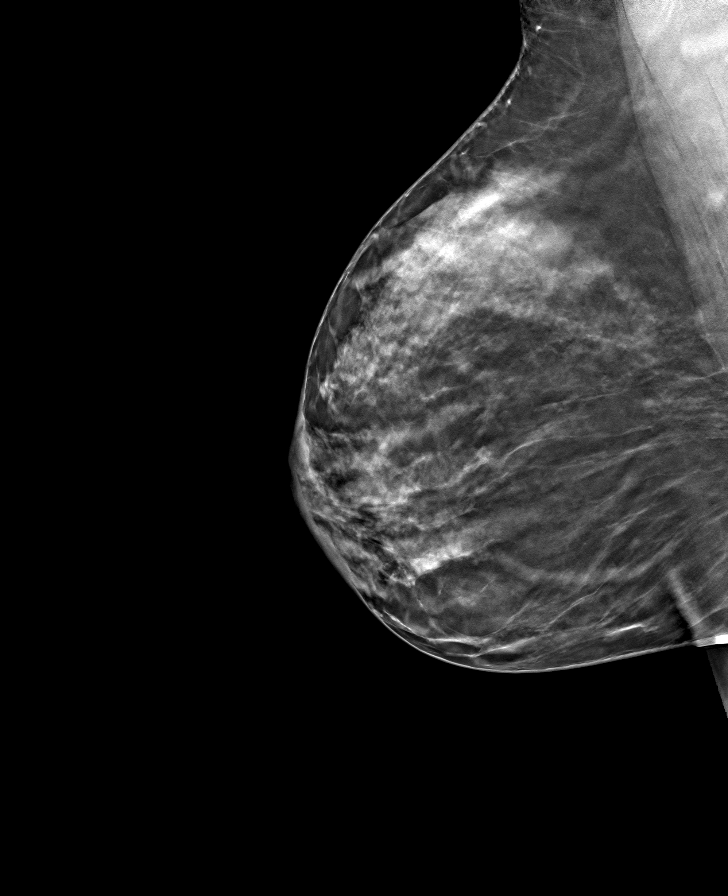

[L MLO tomo · tomo slice 37/73.0]
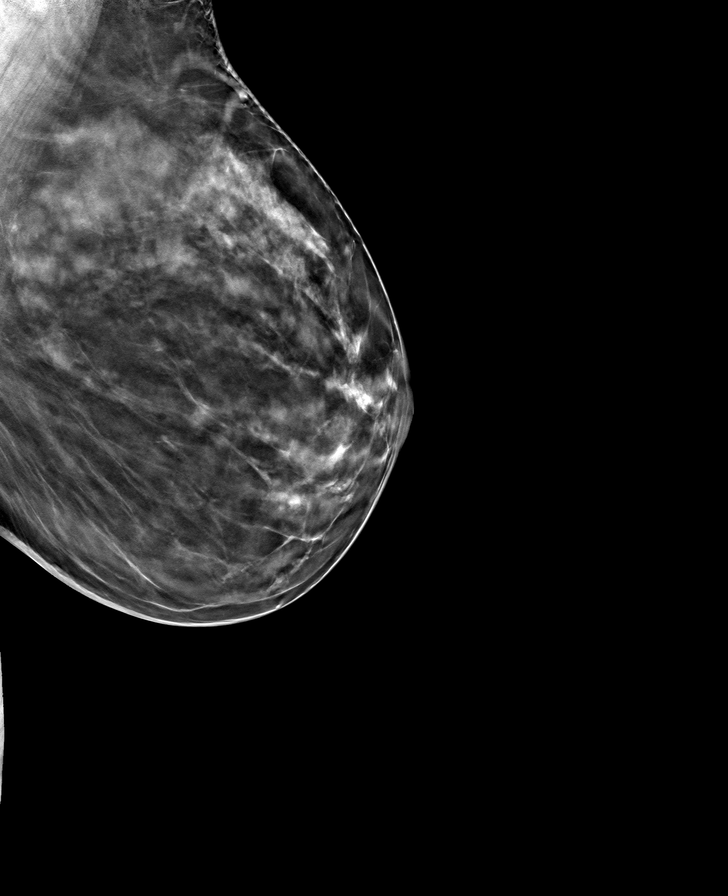

[8 of 24 positions shown; findings below may reference images not displayed]

ACR Breast Density Category c: The breast tissue is heterogeneously
dense, which may obscure small masses.
FINDINGS: There are no findings suspicious for malignancy.
IMPRESSION: No mammographic evidence of malignancy. A result letter of this
screening mammogram will be mailed directly to the patient.

RECOMMENDATION:
Screening mammogram in one year. (Code:Q3-W-BC3)

BI-RADS CATEGORY  1: Negative.

## 2023-05-12 ENCOUNTER — Ambulatory Visit
Admission: RE | Admit: 2023-05-12 | Discharge: 2023-05-12 | Disposition: A | Discharge status: Home / Self Care | Payer: BC Managed Care – PPO | Source: Ambulatory Visit

## 2023-05-12 VITALS — BP 113/72 | HR 91 | Temp 98.7°F | Resp 18

## 2023-05-12 DIAGNOSIS — H1033 Unspecified acute conjunctivitis, bilateral: Secondary | ICD-10-CM

## 2023-05-12 MED ORDER — TOBRAMYCIN 0.3 % OP SOLN: 1.0000 [drp] | OPHTHALMIC | 0 refills | Status: AC

## 2023-05-12 NOTE — ED Triage Notes (Signed)
Patient presents to [facility] for bilateral eye redness and drainage since yesterday. States last week she developed nasal congestion. Treating symptoms with OTC cold/flu meds.

## 2023-05-12 NOTE — ED Provider Notes (Signed)
[facility]    CSN: [id number] Arrival date & time: 05/12/23  1046      History   Chief Complaint Chief Complaint  Patient presents with   Eye Problem    I think I have an eye infection, and they are very red. - Entered by patient    HPI Raven Martin is a 47 y.o. female.   Patient complains of redness to bilateral eyes.  Patient reports she has had a cough and congestion for the past week.  Patient states today both eyes are red and she has swelling  The history is provided by the patient. No language interpreter was used.  Eye Problem Location:  Both eyes Quality:  Aching   Past Medical History:  Diagnosis Date   Asthma    Thyroid disease     Patient Active Problem List   Diagnosis Date Noted   Depression affecting pregnancy, postpartum 03/28/2017   H/O hyperthyroidism 03/14/2017   Mild intermittent asthma without complication 03/14/2017   Preterm delivery 03/14/2017   Twin birth 03/14/2017   Cesarean delivery delivered 01/30/2017   History of bilateral tubal ligation 01/30/2017   Vasa previa 12/22/2016   Short cervix, antepartum 12/22/2016   Influenza A 12/21/2016   Hyperthyroidism affecting pregnancy 12/21/2016   Dichorionic diamniotic twin pregnancy, antepartum 12/21/2016   Asthma 12/21/2016   Metrorrhagia 02/24/2014   Encounter for counseling regarding initiation of other contraceptive measure 09/02/2013   Second-degree perineal laceration in pregnancy 07/29/2013   Dermatitis 07/29/2013   High-risk pregnancy in second trimester 06/28/2013   Glycosuria 05/21/2013   Elderly multigravida with antepartum condition or complication 04/13/2013   Pregnancy test positive 04/13/2013   Pregnancy, incidental 04/13/2013   Encounter for supervision of normal pregnancy in multigravida 02/06/2013    History reviewed. No pertinent surgical history.  OB History     Gravida  5   Para  1   Term  1   Preterm      AB  3   Living  1       SAB  3   IAB      Ectopic      Multiple      Live Births  1            Home Medications    Prior to Admission medications   Medication Sig Start Date End Date Taking? Authorizing Provider  albuterol (VENTOLIN HFA) 108 (90 Base) MCG/ACT inhaler Inhale into the lungs. 01/18/17  Yes [provider]  Ferrous Sulfate (IRON PO) Take 1 tablet by mouth daily. 01/18/17  Yes [provider]  HYDROcodone-acetaminophen (NORCO) 10-325 MG tablet Take by mouth. 01/30/17  Yes [provider]  sertraline (ZOLOFT) 25 MG tablet Take 25 mg by mouth daily. 11/14/22  Yes [provider]  tobramycin (TOBREX) 0.3 % ophthalmic solution Place 1 drop into the right eye every 4 (four) hours for 10 days. 05/12/23 05/22/23 Yes Elson Areas, PA-C  albuterol (PROAIR HFA) 108 (90 Base) MCG/ACT inhaler Inhale 2 puffs into the lungs every 4 (four) hours as needed for wheezing or shortness of breath.  09/07/16   [provider]  methimazole (TAPAZOLE) 5 MG tablet Take 5 mg by mouth every other day. 11/28/16 11/28/17  [provider]    Family History Family History  Problem Relation Age of Onset   Colon cancer Paternal Aunt    Colon polyps Paternal Aunt    Breast cancer Neg Hx    Esophageal cancer  Neg Hx    Rectal cancer Neg Hx    Stomach cancer Neg Hx     Social History Social History   Tobacco Use   Smoking status: Never   Smokeless tobacco: Never  Substance Use Topics   Alcohol use: No   Drug use: No     Allergies   Patient has no known allergies.   Review of Systems Review of Systems  All other systems reviewed and are negative.    Physical Exam Triage Vital Signs ED Triage Vitals  Enc Vitals Group     BP 05/12/23 1220 113/72     Pulse Rate 05/12/23 1220 91     Resp 05/12/23 1220 18     Temp 05/12/23 1220 98.7 F (37.1 C)     Temp Source 05/12/23 1220 Oral     SpO2 05/12/23 1220 97 %     Weight --      Height --      Head  Circumference --      Peak Flow --      Pain Score 05/12/23 1219 0     Pain Loc --      Pain Edu? --      Excl. in GC? --    No data found.  Updated Vital Signs BP 113/72 (BP Location: Left Arm)   Pulse 91   Temp 98.7 F (37.1 C) (Oral)   Resp 18   LMP 05/11/2023 (Approximate)   SpO2 97%   Breastfeeding No   Visual Acuity Right Eye Distance:   Left Eye Distance:   Bilateral Distance:    Right Eye Near:   Left Eye Near:    Bilateral Near:     Physical Exam Vitals and nursing note reviewed.  Constitutional:      Appearance: She is well-developed.  HENT:     Head: Normocephalic.  Eyes:     General:        Right eye: Discharge present.        Left eye: Discharge present. Pulmonary:     Effort: Pulmonary effort is normal.  Abdominal:     General: There is no distension.  Musculoskeletal:        General: Normal range of motion.     Cervical back: Normal range of motion.  Neurological:     Mental Status: She is alert and oriented to person, place, and time.  Psychiatric:        Mood and Affect: Mood normal.      UC Treatments / Results  Labs (all labs ordered are listed, but only abnormal results are displayed) Labs Reviewed - No data to display  EKG   Radiology No results found.  Procedures Procedures (including critical care time)  Medications Ordered in UC Medications - No data to display  Initial Impression / Assessment and Plan / UC Course  I have reviewed the triage vital signs and the nursing notes.  Pertinent labs & imaging results that were available during my care of the patient were reviewed by me and considered in my medical decision making (see chart for details).      Final Clinical Impressions(s) / UC Diagnoses   Final diagnoses:  Acute conjunctivitis of both eyes, unspecified acute conjunctivitis type     Discharge Instructions      Take an over the counter antihistamine like zyrtec of allegra    ED Prescriptions      Medication Sig Dispense Auth. Provider   tobramycin (TOBREX) 0.3 %  ophthalmic solution Place 1 drop into the right eye every 4 (four) hours for 10 days. 5 mL Elson Areas, [place]      PDMP not reviewed this encounter. An After Visit Summary was printed and given to the patient.       Elson Areas, [place] 05/12/23 1303

## 2023-05-12 NOTE — Discharge Instructions (Signed)
Take an over the counter antihistamine like zyrtec of allegra

## 2023-12-11 ENCOUNTER — Other Ambulatory Visit: Payer: Self-pay | Admitting: Family Medicine

## 2023-12-11 DIAGNOSIS — Z1231 Encounter for screening mammogram for malignant neoplasm of breast: Secondary | ICD-10-CM

## 2023-12-28 ENCOUNTER — Ambulatory Visit
Admission: RE | Admit: 2023-12-28 | Discharge: 2023-12-28 | Disposition: A | Discharge status: Home / Self Care | Payer: 59 | Source: Ambulatory Visit | Attending: Family Medicine | Admitting: Family Medicine

## 2023-12-28 DIAGNOSIS — Z1231 Encounter for screening mammogram for malignant neoplasm of breast: Secondary | ICD-10-CM
